# Patient Record
Sex: Female | Born: 1935 | Race: White | Hispanic: No
Health system: Southern US, Community
[De-identification: ages and names within clinical notes are randomized; demographics above are authoritative.]

## PROBLEM LIST (undated history)

## (undated) DIAGNOSIS — Z87820 Personal history of traumatic brain injury: Secondary | ICD-10-CM

## (undated) DIAGNOSIS — G8190 Hemiplegia, unspecified affecting unspecified side: Secondary | ICD-10-CM

## (undated) DIAGNOSIS — F32A Depression, unspecified: Secondary | ICD-10-CM

## (undated) DIAGNOSIS — M6281 Muscle weakness (generalized): Secondary | ICD-10-CM

## (undated) DIAGNOSIS — F419 Anxiety disorder, unspecified: Secondary | ICD-10-CM

## (undated) DIAGNOSIS — S72009D Fracture of unspecified part of neck of unspecified femur, subsequent encounter for closed fracture with routine healing: Secondary | ICD-10-CM

## (undated) DIAGNOSIS — I639 Cerebral infarction, unspecified: Secondary | ICD-10-CM

## (undated) DIAGNOSIS — F0390 Unspecified dementia without behavioral disturbance: Secondary | ICD-10-CM

## (undated) DIAGNOSIS — E785 Hyperlipidemia, unspecified: Secondary | ICD-10-CM

## (undated) DIAGNOSIS — D649 Anemia, unspecified: Secondary | ICD-10-CM

## (undated) DIAGNOSIS — F329 Major depressive disorder, single episode, unspecified: Secondary | ICD-10-CM

## (undated) DIAGNOSIS — R262 Difficulty in walking, not elsewhere classified: Secondary | ICD-10-CM

## (undated) DIAGNOSIS — Z9181 History of falling: Secondary | ICD-10-CM

## (undated) HISTORY — DX: Personal history of traumatic brain injury: Z87.820

## (undated) HISTORY — DX: Muscle weakness (generalized): M62.81

## (undated) HISTORY — DX: Depression, unspecified: F32.A

## (undated) HISTORY — DX: Anxiety disorder, unspecified: F41.9

## (undated) HISTORY — DX: Anemia, unspecified: D64.9

## (undated) HISTORY — DX: Difficulty in walking, not elsewhere classified: R26.2

## (undated) HISTORY — DX: Major depressive disorder, single episode, unspecified: F32.9

## (undated) HISTORY — DX: History of falling: Z91.81

## (undated) HISTORY — DX: Fracture of unspecified part of neck of unspecified femur, subsequent encounter for closed fracture with routine healing: S72.009D

---

## 2008-08-08 ENCOUNTER — Ambulatory Visit: Payer: Self-pay | Admitting: Diagnostic Radiology

## 2008-08-08 ENCOUNTER — Emergency Department (HOSPITAL_BASED_OUTPATIENT_CLINIC_OR_DEPARTMENT_OTHER): Admission: EM | Admit: 2008-08-08 | Discharge: 2008-08-09 | Payer: Self-pay | Admitting: Emergency Medicine

## 2010-04-29 ENCOUNTER — Emergency Department (HOSPITAL_BASED_OUTPATIENT_CLINIC_OR_DEPARTMENT_OTHER)
Admission: EM | Admit: 2010-04-29 | Discharge: 2010-04-29 | Disposition: A | Discharge status: Home / Self Care | Payer: Medicare Other | Attending: Emergency Medicine | Admitting: Emergency Medicine

## 2010-04-29 ENCOUNTER — Emergency Department (HOSPITAL_BASED_OUTPATIENT_CLINIC_OR_DEPARTMENT_OTHER): Payer: Medicare Other

## 2010-04-29 DIAGNOSIS — E785 Hyperlipidemia, unspecified: Secondary | ICD-10-CM | POA: Insufficient documentation

## 2010-04-29 DIAGNOSIS — S0003XA Contusion of scalp, initial encounter: Secondary | ICD-10-CM | POA: Insufficient documentation

## 2010-04-29 DIAGNOSIS — F329 Major depressive disorder, single episode, unspecified: Secondary | ICD-10-CM | POA: Insufficient documentation

## 2010-04-29 DIAGNOSIS — S1093XA Contusion of unspecified part of neck, initial encounter: Secondary | ICD-10-CM | POA: Insufficient documentation

## 2010-04-29 DIAGNOSIS — S0083XA Contusion of other part of head, initial encounter: Secondary | ICD-10-CM

## 2010-04-29 DIAGNOSIS — Z79899 Other long term (current) drug therapy: Secondary | ICD-10-CM | POA: Insufficient documentation

## 2010-04-29 DIAGNOSIS — I10 Essential (primary) hypertension: Secondary | ICD-10-CM | POA: Insufficient documentation

## 2010-04-29 DIAGNOSIS — Y92009 Unspecified place in unspecified non-institutional (private) residence as the place of occurrence of the external cause: Secondary | ICD-10-CM | POA: Insufficient documentation

## 2010-04-29 DIAGNOSIS — R51 Headache: Secondary | ICD-10-CM

## 2010-04-29 DIAGNOSIS — W06XXXA Fall from bed, initial encounter: Secondary | ICD-10-CM

## 2010-04-29 DIAGNOSIS — R220 Localized swelling, mass and lump, head: Secondary | ICD-10-CM | POA: Insufficient documentation

## 2010-04-29 DIAGNOSIS — M542 Cervicalgia: Secondary | ICD-10-CM

## 2010-04-29 DIAGNOSIS — G319 Degenerative disease of nervous system, unspecified: Secondary | ICD-10-CM | POA: Insufficient documentation

## 2010-04-29 DIAGNOSIS — Z8679 Personal history of other diseases of the circulatory system: Secondary | ICD-10-CM | POA: Insufficient documentation

## 2010-04-29 DIAGNOSIS — F3289 Other specified depressive episodes: Secondary | ICD-10-CM | POA: Insufficient documentation

## 2010-09-01 ENCOUNTER — Emergency Department (INDEPENDENT_AMBULATORY_CARE_PROVIDER_SITE_OTHER): Payer: Medicare Other

## 2010-09-01 ENCOUNTER — Emergency Department (HOSPITAL_BASED_OUTPATIENT_CLINIC_OR_DEPARTMENT_OTHER)
Admission: EM | Admit: 2010-09-01 | Discharge: 2010-09-01 | Disposition: A | Discharge status: Home / Self Care | Payer: Medicare Other | Attending: Emergency Medicine | Admitting: Emergency Medicine

## 2010-09-01 DIAGNOSIS — R05 Cough: Secondary | ICD-10-CM

## 2010-09-01 DIAGNOSIS — R0602 Shortness of breath: Secondary | ICD-10-CM

## 2010-09-01 DIAGNOSIS — E785 Hyperlipidemia, unspecified: Secondary | ICD-10-CM | POA: Insufficient documentation

## 2010-09-01 DIAGNOSIS — R0902 Hypoxemia: Secondary | ICD-10-CM | POA: Insufficient documentation

## 2010-09-01 DIAGNOSIS — J449 Chronic obstructive pulmonary disease, unspecified: Secondary | ICD-10-CM | POA: Insufficient documentation

## 2010-09-01 DIAGNOSIS — F0390 Unspecified dementia without behavioral disturbance: Secondary | ICD-10-CM | POA: Insufficient documentation

## 2010-09-01 DIAGNOSIS — M810 Age-related osteoporosis without current pathological fracture: Secondary | ICD-10-CM | POA: Insufficient documentation

## 2010-09-01 DIAGNOSIS — Z79899 Other long term (current) drug therapy: Secondary | ICD-10-CM | POA: Insufficient documentation

## 2010-09-01 DIAGNOSIS — J4489 Other specified chronic obstructive pulmonary disease: Secondary | ICD-10-CM | POA: Insufficient documentation

## 2010-09-01 DIAGNOSIS — I10 Essential (primary) hypertension: Secondary | ICD-10-CM | POA: Insufficient documentation

## 2010-09-01 LAB — DIFFERENTIAL
Basophils Relative: 1 % (ref 0–1)
Lymphocytes Relative: 25 % (ref 12–46)
Lymphs Abs: 1.7 10*3/uL (ref 0.7–4.0)
Monocytes Absolute: 0.7 10*3/uL (ref 0.1–1.0)
Monocytes Relative: 10 % (ref 3–12)
Neutro Abs: 4 10*3/uL (ref 1.7–7.7)
Neutrophils Relative %: 59 % (ref 43–77)

## 2010-09-01 LAB — CBC
HCT: 39.7 % (ref 36.0–46.0)
Hemoglobin: 12.6 g/dL (ref 12.0–15.0)
MCH: 27.9 pg (ref 26.0–34.0)
MCHC: 31.7 g/dL (ref 30.0–36.0)
RBC: 4.51 MIL/uL (ref 3.87–5.11)

## 2010-09-01 LAB — BASIC METABOLIC PANEL
CO2: 26 mEq/L (ref 19–32)
Chloride: 101 mEq/L (ref 96–112)
Creatinine, Ser: 1.1 mg/dL (ref 0.4–1.2)
GFR calc Af Amer: 58 mL/min — ABNORMAL LOW (ref >60)
Glucose, Bld: 98 mg/dL (ref 70–99)
Sodium: 137 mEq/L (ref 135–145)

## 2011-05-24 ENCOUNTER — Emergency Department (INDEPENDENT_AMBULATORY_CARE_PROVIDER_SITE_OTHER): Payer: Medicare Other

## 2011-05-24 ENCOUNTER — Encounter (HOSPITAL_BASED_OUTPATIENT_CLINIC_OR_DEPARTMENT_OTHER): Payer: Self-pay | Admitting: *Deleted

## 2011-05-24 ENCOUNTER — Emergency Department (HOSPITAL_BASED_OUTPATIENT_CLINIC_OR_DEPARTMENT_OTHER)
Admission: EM | Admit: 2011-05-24 | Discharge: 2011-05-24 | Disposition: A | Discharge status: Skilled Nursing Facility | Payer: Medicare Other | Attending: Emergency Medicine | Admitting: Emergency Medicine

## 2011-05-24 DIAGNOSIS — R918 Other nonspecific abnormal finding of lung field: Secondary | ICD-10-CM

## 2011-05-24 DIAGNOSIS — F0390 Unspecified dementia without behavioral disturbance: Secondary | ICD-10-CM | POA: Insufficient documentation

## 2011-05-24 DIAGNOSIS — X58XXXA Exposure to other specified factors, initial encounter: Secondary | ICD-10-CM | POA: Insufficient documentation

## 2011-05-24 DIAGNOSIS — M949 Disorder of cartilage, unspecified: Secondary | ICD-10-CM

## 2011-05-24 DIAGNOSIS — M545 Low back pain, unspecified: Secondary | ICD-10-CM | POA: Insufficient documentation

## 2011-05-24 DIAGNOSIS — M549 Dorsalgia, unspecified: Secondary | ICD-10-CM

## 2011-05-24 DIAGNOSIS — Y9210 Unspecified residential institution as the place of occurrence of the external cause: Secondary | ICD-10-CM | POA: Insufficient documentation

## 2011-05-24 DIAGNOSIS — Z7982 Long term (current) use of aspirin: Secondary | ICD-10-CM | POA: Insufficient documentation

## 2011-05-24 DIAGNOSIS — M8448XA Pathological fracture, other site, initial encounter for fracture: Secondary | ICD-10-CM

## 2011-05-24 DIAGNOSIS — S32009A Unspecified fracture of unspecified lumbar vertebra, initial encounter for closed fracture: Secondary | ICD-10-CM | POA: Insufficient documentation

## 2011-05-24 DIAGNOSIS — E785 Hyperlipidemia, unspecified: Secondary | ICD-10-CM | POA: Insufficient documentation

## 2011-05-24 DIAGNOSIS — Z8673 Personal history of transient ischemic attack (TIA), and cerebral infarction without residual deficits: Secondary | ICD-10-CM | POA: Insufficient documentation

## 2011-05-24 DIAGNOSIS — M479 Spondylosis, unspecified: Secondary | ICD-10-CM

## 2011-05-24 DIAGNOSIS — IMO0002 Reserved for concepts with insufficient information to code with codable children: Secondary | ICD-10-CM

## 2011-05-24 DIAGNOSIS — I719 Aortic aneurysm of unspecified site, without rupture: Secondary | ICD-10-CM | POA: Insufficient documentation

## 2011-05-24 DIAGNOSIS — I714 Abdominal aortic aneurysm, without rupture: Secondary | ICD-10-CM

## 2011-05-24 DIAGNOSIS — R222 Localized swelling, mass and lump, trunk: Secondary | ICD-10-CM | POA: Insufficient documentation

## 2011-05-24 HISTORY — DX: Hyperlipidemia, unspecified: E78.5

## 2011-05-24 HISTORY — DX: Unspecified dementia, unspecified severity, without behavioral disturbance, psychotic disturbance, mood disturbance, and anxiety: F03.90

## 2011-05-24 HISTORY — DX: Cerebral infarction, unspecified: I63.9

## 2011-05-24 HISTORY — DX: Hemiplegia, unspecified affecting unspecified side: G81.90

## 2011-05-24 MED ORDER — HYDROCODONE-ACETAMINOPHEN 5-500 MG PO TABS: 1.0000 | ORAL_TABLET | Freq: Four times a day (QID) | ORAL | Status: AC | PRN

## 2011-05-24 MED ORDER — HYDROCODONE-ACETAMINOPHEN 5-500 MG PO TABS: 1.0000 | ORAL_TABLET | Freq: Four times a day (QID) | ORAL | Status: DC | PRN

## 2011-05-24 NOTE — ED Notes (Signed)
EMS reports patient has had right lower back pain for the last 2 days.  No known injury or fall.

## 2011-05-24 NOTE — ED Provider Notes (Signed)
History     CSN: [id number]  Arrival date & time 05/24/11  1105   First MD Initiated Contact with Patient 05/24/11 1127      Chief Complaint  Patient presents with  . Back Pain    right    (Consider location/radiation/quality/duration/timing/severity/associated sxs/prior treatment) HPI Comments: Patient was sent [facility] with complaints of lower back pain. She does have a history of dementia, but complains of pain to her right lower back area, particularly when she moves her, report. She's had some recent coughing, chest congestion, and was recently treated with antibiotics per report the cough has been improving, but now patient is complaining of some low back pain. There is no known history of any recent falls. She denies any weakness or numbness to extremities. However, her history is limited due to her dementia  The history is provided by the patient.    Past Medical History  Diagnosis Date  . Dementia   . CVA (cerebral infarction)   . Hemiparesis     right  . Dysphagia   . Osteoporosis   . Hyperlipemia     History reviewed. No pertinent past surgical history.  No family history on file.  History  Substance Use Topics  . Smoking status: Never Smoker   . Smokeless tobacco: Not on file  . Alcohol Use: No    OB History    Grav Para Term Preterm Abortions TAB SAB Ect Mult Living                  Review of Systems  Unable to perform ROS: Dementia    Allergies  Penicillins  Home Medications   Current Outpatient Rx  Name Route Sig Dispense Refill  . ACETAMINOPHEN 500 MG PO TABS Oral Take 1,000 mg by mouth every 6 (six) hours as needed.    . ALBUTEROL SULFATE (2.5 MG/3ML) 0.083% IN NEBU Nebulization Take 2.5 mg by nebulization every 6 (six) hours as needed.    . ASPIRIN 81 MG PO TABS Oral Take 81 mg by mouth daily.    Marland Kitchen VITAMIN D 1000 UNITS PO TABS Oral Take 1,000 Units by mouth daily.    Marland Kitchen DEXTROMETHORPHAN HBR 15 MG/5ML PO SYRP Oral Take 10  mLs by mouth 4 (four) times daily as needed.    Marland Kitchen DIVALPROEX SODIUM 125 MG PO CPSP Oral Take 125 mg by mouth 2 (two) times daily.    . DONEPEZIL HCL 5 MG PO TABS Oral Take 5 mg by mouth at bedtime as needed.    Marland Kitchen FAMOTIDINE 20 MG PO TABS Oral Take 20 mg by mouth 2 (two) times daily.    Marland Kitchen LORAZEPAM 0.5 MG PO TABS Oral Take 0.5 mg by mouth every 8 (eight) hours.    Marland Kitchen MINERAL OIL LIGHT OIL Does not apply by Does not apply route.    . SENNA 8.6 MG PO TABS Oral Take 1 tablet by mouth at bedtime as needed.    . SERTRALINE HCL 50 MG PO TABS Oral Take 50 mg by mouth daily.    Marland Kitchen SIMVASTATIN 10 MG PO TABS Oral Take 10 mg by mouth at bedtime.    Marland Kitchen VITAMIN A 40981 UNITS PO CAPS Oral Take 10,000 Units by mouth daily.    Marland Kitchen HYDROCODONE-ACETAMINOPHEN 5-500 MG PO TABS Oral Take 1-2 tablets by mouth every 6 (six) hours as needed for pain. 15 tablet 0    BP 177/75  Pulse 67  Temp(Src) 97.8 F (36.6 C) (  Oral)  Resp 20  Ht 5\' 5"  (1.651 m)  Wt 140 lb (63.504 kg)  BMI 23.30 kg/m2  SpO2 96%  Physical Exam  Constitutional: She is oriented to person, place, and time. She appears well-developed and well-nourished.  HENT:  Head: Normocephalic and atraumatic.  Eyes: Pupils are equal, round, and reactive to light.  Neck: Normal range of motion. Neck supple.  Cardiovascular: Normal rate, regular rhythm and normal heart sounds.   Pulmonary/Chest: Effort normal and breath sounds normal. No respiratory distress. She has no wheezes. She has no rales. She exhibits no tenderness.  Abdominal: Soft. Bowel sounds are normal. There is no tenderness. There is no rebound and no guarding.  Musculoskeletal: Normal range of motion. She exhibits no edema.       Patient has some mild tenderness to palpation of the right lower lumbar region. There is no specific pain on palpation of the spine. Negative straight leg raise bilaterally. She seems to have normal sensation motor function in extremities. Normal pulses and extremities    Lymphadenopathy:    She has no cervical adenopathy.  Neurological: She is alert and oriented to person, place, and time.  Skin: Skin is warm and dry. No rash noted.  Psychiatric: She has a normal mood and affect.    ED Course  Procedures (including critical care time)  Labs Reviewed - No data to display Dg Chest 2 View  05/24/2011  *RADIOLOGY REPORT*  Clinical Data: Right low back pain.  CHEST - 2 VIEW  Comparison: 09/01/2010.  Findings: Trachea is midline.  Heart size stable. Tortuous descending thoracic aorta and/or moderate hiatal hernia, stable. There is soft tissue prominence of the right hilum with added density in the right infrahilar region.  Findings appear new from 09/01/2010.  The lungs are hyperinflated but otherwise clear.  No pleural fluid.  Lower thoracic or upper lumbar vertebroplasty.  Probable upper lumbar compression fracture.  IMPRESSION: Right hilar fullness with right infrahilar soft tissue density, new from the prior study.  Non emergent chest CT with contrast is recommended. These results will be called to the ordering clinician or representative by the Radiologist Assistant, and communication documented in the PACS Dashboard.  Original Report Authenticated By: Reyes Ivan, M.D.   Dg Lumbar Spine Complete  05/24/2011  *RADIOLOGY REPORT*  Clinical Data: Right lower back pain.  LUMBAR SPINE - COMPLETE 4+ VIEW  Comparison: None.  Findings: Osteopenia.  Mild levoconvex curvature of the lumbar spine.  L1 vertebroplasty. Moderate L4 compression fracture. Alignment is otherwise anatomic and vertebral body height maintained.  Multilevel endplate degenerative changes are seen. Findings are worst at L5-S1, where there is loss of disc space height and facet hypertrophy.  There is atherosclerotic calcification of the arterial vasculature. Infrarenal aorta measures up to approximately 4.7 cm.  IMPRESSION:  1.  L4 compression fracture, age determinate. Per CMS PQRS reporting  requirements (PQRS Measure 24): Given the patient's age of greater than 50 and the fracture site (hip, distal radius, or spine), the patient should be tested for osteoporosis using DXA, and the appropriate treatment considered based on the DXA results. 2.  Osteopenia with multilevel spondylosis, worst at L5-S1. 3.  Infrarenal aortic aneurysm.  Original Report Authenticated By: Reyes Ivan, M.D.     1. Back pain   2. Compression fracture   3. Aortic aneurysm   4. Lung mass       MDM  Patient with L4 compression fracture uncertain if this is old or new. She  has previously been treated in the past for compression fracture. This is likely the cause of patient's pain of note, she did have some incidental findings including a right parahilar mass on a chest x-ray and an infrarenal aortic aneurysm measuring 4.7 cm. I did advise the nurse practitioner that manages the patient at [facility] of these findings and they will follow this up as an outpatient. The nurse practitioner's name is  [employer].  She will be seeing the patient tomorrow        Rolan Bucco, MD 05/24/11 1433

## 2011-05-24 NOTE — Discharge Instructions (Signed)
Back Pain, Adult Low back pain is very common. About 1 in 5 people have back pain.The cause of low back pain is rarely dangerous. The pain often gets better over time.About half of people with a sudden onset of back pain feel better in just 2 weeks. About 8 in 10 people feel better by 6 weeks.  CAUSES Some common causes of back pain include:  Strain of the muscles or ligaments supporting the spine.   Wear and tear (degeneration) of the spinal discs.   Arthritis.   Direct injury to the back.  DIAGNOSIS Most of the time, the direct cause of low back pain is not known.However, back pain can be treated effectively even when the exact cause of the pain is unknown.Answering your caregiver's questions about your overall health and symptoms is one of the most accurate ways to make sure the cause of your pain is not dangerous. If your caregiver needs more information, he or she may order lab work or imaging tests (X-rays or MRIs).However, even if imaging tests show changes in your back, this usually does not require surgery. HOME CARE INSTRUCTIONS For many people, back pain returns.Since low back pain is rarely dangerous, it is often a condition that people can learn to manageon their own.   Remain active. It is stressful on the back to sit or stand in one place. Do not sit, drive, or stand in one place for more than 30 minutes at a time. Take short walks on level surfaces as soon as pain allows.Try to increase the length of time you walk each day.   Do not stay in bed.Resting more than 1 or 2 days can delay your recovery.   Do not avoid exercise or work.Your body is made to move.It is not dangerous to be active, even though your back may hurt.Your back will likely heal faster if you return to being active before your pain is gone.   Pay attention to your body when you bend and lift. Many people have less discomfortwhen lifting if they bend their knees, keep the load close to their  bodies,and avoid twisting. Often, the most comfortable positions are those that put less stress on your recovering back.   Find a comfortable position to sleep. Use a firm mattress and lie on your side with your knees slightly bent. If you lie on your back, put a pillow under your knees.   Only take over-the-counter or prescription medicines as directed by your caregiver. Over-the-counter medicines to reduce pain and inflammation are often the most helpful.Your caregiver may prescribe muscle relaxant drugs.These medicines help dull your pain so you can more quickly return to your normal activities and healthy exercise.   Put ice on the injured area.   Put ice in a plastic bag.   Place a towel between your skin and the bag.   Leave the ice on for 15 to 20 minutes, 3 to 4 times a day for the first 2 to 3 days. After that, ice and heat may be alternated to reduce pain and spasms.   Ask your caregiver about trying back exercises and gentle massage. This may be of some benefit.   Avoid feeling anxious or stressed.Stress increases muscle tension and can worsen back pain.It is important to recognize when you are anxious or stressed and learn ways to manage it.Exercise is a great option.  SEEK MEDICAL CARE IF:  You have pain that is not relieved with rest or medicine.   You have   pain that does not improve in 1 week.   You have new symptoms.   You are generally not feeling well.  SEEK IMMEDIATE MEDICAL CARE IF:   You have pain that radiates from your back into your legs.   You develop new bowel or bladder control problems.   You have unusual weakness or numbness in your arms or legs.   You develop nausea or vomiting.   You develop abdominal pain.   You feel faint.  Document Released: 03/13/2005 Document Revised: 11/23/2010 Document Reviewed: 08/01/2010 [employer] Patient Information 2012 [employer]. 

## 2011-08-29 ENCOUNTER — Emergency Department (HOSPITAL_BASED_OUTPATIENT_CLINIC_OR_DEPARTMENT_OTHER)
Admission: EM | Admit: 2011-08-29 | Discharge: 2011-08-29 | Disposition: A | Discharge status: Home / Self Care | Payer: Medicare Other | Attending: Emergency Medicine | Admitting: Emergency Medicine

## 2011-08-29 ENCOUNTER — Encounter (HOSPITAL_BASED_OUTPATIENT_CLINIC_OR_DEPARTMENT_OTHER): Payer: Self-pay | Admitting: *Deleted

## 2011-08-29 ENCOUNTER — Emergency Department (HOSPITAL_BASED_OUTPATIENT_CLINIC_OR_DEPARTMENT_OTHER): Payer: Medicare Other

## 2011-08-29 DIAGNOSIS — M542 Cervicalgia: Secondary | ICD-10-CM | POA: Insufficient documentation

## 2011-08-29 DIAGNOSIS — R51 Headache: Secondary | ICD-10-CM | POA: Insufficient documentation

## 2011-08-29 DIAGNOSIS — W19XXXA Unspecified fall, initial encounter: Secondary | ICD-10-CM

## 2011-08-29 DIAGNOSIS — F0390 Unspecified dementia without behavioral disturbance: Secondary | ICD-10-CM | POA: Insufficient documentation

## 2011-08-29 DIAGNOSIS — E785 Hyperlipidemia, unspecified: Secondary | ICD-10-CM | POA: Insufficient documentation

## 2011-08-29 DIAGNOSIS — T1490XA Injury, unspecified, initial encounter: Secondary | ICD-10-CM | POA: Insufficient documentation

## 2011-08-29 DIAGNOSIS — Z79899 Other long term (current) drug therapy: Secondary | ICD-10-CM | POA: Insufficient documentation

## 2011-08-29 NOTE — ED Provider Notes (Signed)
Medical screening examination/treatment/procedure(s) were performed by non-physician practitioner and as supervising physician I was immediately available for consultation/collaboration.    Nelia Shi, MD 08/29/11 [phone]

## 2011-08-29 NOTE — ED Provider Notes (Signed)
History     CSN: [id number]  Arrival date & time 08/29/11  1600   First MD Initiated Contact with Patient 08/29/11 1606      Chief Complaint  Patient presents with  . Fall  . Head Injury    (Consider location/radiation/quality/duration/timing/severity/associated sxs/prior treatment) HPI Comments: Nursing home reports that pt has just gotten out of the shower and gotten dressed and she went to stand up and fell backwards:staff tried to catch her but she fell and hit her head:no loc:pt is acting at baseline  Patient is a 78 y.o. female presenting with fall and head injury. The history is provided by the nursing home. No language interpreter was used.  Fall  Head Injury     Past Medical History  Diagnosis Date  . Dementia   . CVA (cerebral infarction)   . Hemiparesis     right  . Dysphagia   . Osteoporosis   . Hyperlipemia     History reviewed. No pertinent past surgical history.  History reviewed. No pertinent family history.  History  Substance Use Topics  . Smoking status: Never Smoker   . Smokeless tobacco: Not on file  . Alcohol Use: No    OB History    Grav Para Term Preterm Abortions TAB SAB Ect Mult Living                  Review of Systems  Unable to perform ROS: Dementia  Cardiovascular: Negative.     Allergies  Penicillins  Home Medications   Current Outpatient Rx  Name Route Sig Dispense Refill  . ACETAMINOPHEN 500 MG PO TABS Oral Take 1,000 mg by mouth every 6 (six) hours as needed.    . ALBUTEROL SULFATE (2.5 MG/3ML) 0.083% IN NEBU Nebulization Take 2.5 mg by nebulization every 6 (six) hours as needed.    . ASPIRIN 81 MG PO TABS Oral Take 81 mg by mouth daily.    Marland Kitchen VITAMIN D 1000 UNITS PO TABS Oral Take 1,000 Units by mouth daily.    Marland Kitchen DEXTROMETHORPHAN HBR 15 MG/5ML PO SYRP Oral Take 10 mLs by mouth 4 (four) times daily as needed.    Marland Kitchen DIVALPROEX SODIUM 125 MG PO CPSP Oral Take 125 mg by mouth 2 (two) times daily.    . DONEPEZIL HCL 5  MG PO TABS Oral Take 5 mg by mouth at bedtime as needed.    Marland Kitchen FAMOTIDINE 20 MG PO TABS Oral Take 20 mg by mouth 2 (two) times daily.    Marland Kitchen LORAZEPAM 0.5 MG PO TABS Oral Take 0.5 mg by mouth every 8 (eight) hours.    Marland Kitchen MINERAL OIL LIGHT OIL Does not apply by Does not apply route.    . SENNA 8.6 MG PO TABS Oral Take 1 tablet by mouth at bedtime as needed.    . SERTRALINE HCL 50 MG PO TABS Oral Take 50 mg by mouth daily.    Marland Kitchen SIMVASTATIN 10 MG PO TABS Oral Take 10 mg by mouth at bedtime.    Marland Kitchen VITAMIN A 21308 UNITS PO CAPS Oral Take 10,000 Units by mouth daily.      BP 116/72  Pulse 90  Temp(Src) 97.1 F (36.2 C) (Oral)  Resp 18  SpO2 94%  Physical Exam  Nursing note and vitals reviewed. Constitutional: She appears well-developed and well-nourished.  HENT:  Head: Normocephalic and atraumatic.       No sign of head injury noted  Eyes: Conjunctivae are normal.  Cardiovascular: Normal  rate and regular rhythm.   Pulmonary/Chest: Effort normal and breath sounds normal.  Musculoskeletal: Normal range of motion.  Neurological: She is alert.  Skin: Skin is warm and dry.  Psychiatric: She has a normal mood and affect.    ED Course  Procedures (including critical care time)  Labs Reviewed - No data to display Ct Head Wo Contrast  08/29/2011  *RADIOLOGY REPORT*  Clinical Data:  Larey Seat getting out of the shower.  Head pain.  Neck pain. Knot behind right ear  CT HEAD WITHOUT CONTRAST CT CERVICAL SPINE WITHOUT CONTRAST  Technique:  Multidetector CT imaging of the head and cervical spine was performed following the standard protocol without intravenous contrast.  Multiplanar CT image reconstructions of the cervical spine were also generated.  Comparison:  CT head 04/30/2011  CT HEAD  Findings: There is no evidence for acute infarction, intracranial hemorrhage, mass lesion, hydrocephalus, or extra-axial fluid. Moderate to severe atrophy is present.  Advanced chronic microvascular ischemic change is  present in the periventricular and subcortical white matter.  The calvarium is intact.  No significant scalp hematoma is observed.  There is slight air-fluid level left division sphenoid sinus not felt to be acute.  Mild mucosal thickening left ethmoid air cells.  Moderate vascular calcification.  Negative orbits.  The right mastoid tip is somewhat larger than the left.  It is unclear if this could account for the patient's knot behind her right ear.  Compared with 04/30/2011, the appearance is similar.  IMPRESSION: Baseline atrophy with chronic microvascular ischemic change.  No skull fracture or intracranial hemorrhage.  No significant scalp hematoma.  Small air-fluid level left division sphenoid sinus, but no evidence for basilar skull fracture.  CT CERVICAL SPINE  Findings: The patient had difficulty remaining motionless for the study.  Images are suboptimal.  Small or subtle lesions could be overlooked.  There is no visible cervical spine fracture or traumatic subluxation.  Skeletal osteopenia is suggested with coarsened trabeculae.  No prevertebral soft tissue swelling is observed. There is slight facet mediated slip C2 on C3 of approximately 2 mm. Advanced disc space narrowing is present at C4-5, C5-6, and C6-7. There is no intraspinal hematoma.  The odontoid is intact.  There is moderate carotid calcification bilaterally.  Lung apices appear clear.  Trachea midline. No neck masses.  IMPRESSION: Motion degraded exam.  No visible cervical spine fracture.  Original Report Authenticated By: Elsie Stain, M.D.   Ct Cervical Spine Wo Contrast  08/29/2011  *RADIOLOGY REPORT*  Clinical Data:  Larey Seat getting out of the shower.  Head pain.  Neck pain. Knot behind right ear  CT HEAD WITHOUT CONTRAST CT CERVICAL SPINE WITHOUT CONTRAST  Technique:  Multidetector CT imaging of the head and cervical spine was performed following the standard protocol without intravenous contrast.  Multiplanar CT image reconstructions of  the cervical spine were also generated.  Comparison:  CT head 04/30/2011  CT HEAD  Findings: There is no evidence for acute infarction, intracranial hemorrhage, mass lesion, hydrocephalus, or extra-axial fluid. Moderate to severe atrophy is present.  Advanced chronic microvascular ischemic change is present in the periventricular and subcortical white matter.  The calvarium is intact.  No significant scalp hematoma is observed.  There is slight air-fluid level left division sphenoid sinus not felt to be acute.  Mild mucosal thickening left ethmoid air cells.  Moderate vascular calcification.  Negative orbits.  The right mastoid tip is somewhat larger than the left.  It is unclear if this  could account for the patient's knot behind her right ear.  Compared with 04/30/2011, the appearance is similar.  IMPRESSION: Baseline atrophy with chronic microvascular ischemic change.  No skull fracture or intracranial hemorrhage.  No significant scalp hematoma.  Small air-fluid level left division sphenoid sinus, but no evidence for basilar skull fracture.  CT CERVICAL SPINE  Findings: The patient had difficulty remaining motionless for the study.  Images are suboptimal.  Small or subtle lesions could be overlooked.  There is no visible cervical spine fracture or traumatic subluxation.  Skeletal osteopenia is suggested with coarsened trabeculae.  No prevertebral soft tissue swelling is observed. There is slight facet mediated slip C2 on C3 of approximately 2 mm. Advanced disc space narrowing is present at C4-5, C5-6, and C6-7. There is no intraspinal hematoma.  The odontoid is intact.  There is moderate carotid calcification bilaterally.  Lung apices appear clear.  Trachea midline. No neck masses.  IMPRESSION: Motion degraded exam.  No visible cervical spine fracture.  Original Report Authenticated By: Elsie Stain, M.D.     1. Fall       MDM  No acute abnormality noted on ct:pt is okay to go back to the nursing  home        Teressa Lower, NP 08/29/11 1702  Teressa Lower, NP 08/29/11 1703

## 2011-08-29 NOTE — ED Notes (Signed)
Brought in by ems for fall on tile floor ASSISTED TO FLOOR, pt from [employer], denies complaint

## 2012-07-02 ENCOUNTER — Non-Acute Institutional Stay (SKILLED_NURSING_FACILITY): Payer: Medicare Other | Admitting: Internal Medicine

## 2012-07-02 DIAGNOSIS — S7290XD Unspecified fracture of unspecified femur, subsequent encounter for closed fracture with routine healing: Secondary | ICD-10-CM

## 2012-07-02 DIAGNOSIS — S7291XE Unspecified fracture of right femur, subsequent encounter for open fracture type I or II with routine healing: Secondary | ICD-10-CM

## 2012-07-02 DIAGNOSIS — F0390 Unspecified dementia without behavioral disturbance: Secondary | ICD-10-CM

## 2012-07-02 DIAGNOSIS — R0902 Hypoxemia: Secondary | ICD-10-CM

## 2012-07-02 DIAGNOSIS — D62 Acute posthemorrhagic anemia: Secondary | ICD-10-CM

## 2012-07-03 ENCOUNTER — Non-Acute Institutional Stay (SKILLED_NURSING_FACILITY): Payer: Medicare Other | Admitting: Adult Health

## 2012-07-03 ENCOUNTER — Encounter: Payer: Self-pay | Admitting: Adult Health

## 2012-07-03 ENCOUNTER — Other Ambulatory Visit: Payer: Self-pay | Admitting: *Deleted

## 2012-07-03 DIAGNOSIS — F0391 Unspecified dementia with behavioral disturbance: Secondary | ICD-10-CM | POA: Insufficient documentation

## 2012-07-03 DIAGNOSIS — I10 Essential (primary) hypertension: Secondary | ICD-10-CM

## 2012-07-03 DIAGNOSIS — E559 Vitamin D deficiency, unspecified: Secondary | ICD-10-CM

## 2012-07-03 DIAGNOSIS — S72009D Fracture of unspecified part of neck of unspecified femur, subsequent encounter for closed fracture with routine healing: Secondary | ICD-10-CM

## 2012-07-03 DIAGNOSIS — F411 Generalized anxiety disorder: Secondary | ICD-10-CM

## 2012-07-03 DIAGNOSIS — K5900 Constipation, unspecified: Secondary | ICD-10-CM | POA: Insufficient documentation

## 2012-07-03 DIAGNOSIS — E785 Hyperlipidemia, unspecified: Secondary | ICD-10-CM

## 2012-07-03 DIAGNOSIS — S72001D Fracture of unspecified part of neck of right femur, subsequent encounter for closed fracture with routine healing: Secondary | ICD-10-CM

## 2012-07-03 DIAGNOSIS — Z8673 Personal history of transient ischemic attack (TIA), and cerebral infarction without residual deficits: Secondary | ICD-10-CM | POA: Insufficient documentation

## 2012-07-03 DIAGNOSIS — S72001A Fracture of unspecified part of neck of right femur, initial encounter for closed fracture: Secondary | ICD-10-CM | POA: Insufficient documentation

## 2012-07-03 DIAGNOSIS — F329 Major depressive disorder, single episode, unspecified: Secondary | ICD-10-CM

## 2012-07-03 DIAGNOSIS — Z7901 Long term (current) use of anticoagulants: Secondary | ICD-10-CM | POA: Insufficient documentation

## 2012-07-03 MED ORDER — LORAZEPAM 0.5 MG PO TABS: ORAL_TABLET | ORAL | Status: DC

## 2012-07-03 NOTE — Progress Notes (Signed)
  Subjective:    Patient ID: Mallory Herring, female    DOB: 02/01/1931, 79 y.o.   MRN: [id number]  HPI This is an 79 year old female who has been admitted to [facility] [place] on 07/01/12 from [facility]. She fell and sustained a right hip fracture S/P hemiarthroplasty. She has been admitted for rehabilitation and long-term care. She is confused and had an episode of pulling the dressing on her right hip while yelling.   Review of Systems  Constitutional: Negative.   HENT: Negative.   Eyes: Negative.   Respiratory: Negative for chest tightness and shortness of breath.   Cardiovascular: Negative for leg swelling.  Gastrointestinal: Negative for abdominal pain, constipation and abdominal distention.  Endocrine: Negative.   Genitourinary: Negative.   Neurological: Negative.   Hematological: Negative for adenopathy. Does not bruise/bleed easily.  Psychiatric/Behavioral: Negative.        Objective:   Physical Exam  Constitutional: She appears well-developed and well-nourished.  HENT:  Head: Normocephalic.  Right Ear: External ear normal.  Left Ear: External ear normal.  Eyes: Conjunctivae are normal. Pupils are equal, round, and reactive to light.  Neck: Normal range of motion. Neck supple. No thyromegaly present.  Cardiovascular: Normal rate, regular rhythm and normal heart sounds.   Pulmonary/Chest: Effort normal and breath sounds normal. No respiratory distress.  Abdominal: Soft. Bowel sounds are normal. She exhibits no distension. There is no tenderness.  Musculoskeletal: She exhibits no edema.  Neurological: She is alert.  Skin: Skin is warm.  Psychiatric:  Alert to self but confused to time and place   LABS: 07/03/12  Wbc 6.4  hgb 9.4  hct 29.3  Bmp nl except calcium 7.9 CXR shows minimal bronchitis at the right perihilar region       Assessment & Plan:  Patient Active Problem List  Long term (current) use of anticoagulants -INR 2.5 - therapeutic;  continue Coumadin 5 mg PO Q  D  Generalized anxiety disorder - will continue Ativan 0.5 mg PO BID  Other and unspecified hyperlipidemia - stable   Dementia with behavioral disturbance - stable; obtain urinalysis w/ culture  History of CVA (cerebrovascular accident) - stable  Unspecified constipation - stable  Depression - stable  Unspecified vitamin D deficiency - continue supplementation  Hip fracture, right S/P hemiarthroplasty - for PT and OT

## 2012-07-06 ENCOUNTER — Non-Acute Institutional Stay (SKILLED_NURSING_FACILITY): Payer: Medicare Other | Admitting: Adult Health

## 2012-07-06 DIAGNOSIS — S72001D Fracture of unspecified part of neck of right femur, subsequent encounter for closed fracture with routine healing: Secondary | ICD-10-CM

## 2012-07-06 DIAGNOSIS — Z7901 Long term (current) use of anticoagulants: Secondary | ICD-10-CM

## 2012-07-06 DIAGNOSIS — S72009D Fracture of unspecified part of neck of unspecified femur, subsequent encounter for closed fracture with routine healing: Secondary | ICD-10-CM

## 2012-07-09 ENCOUNTER — Non-Acute Institutional Stay (SKILLED_NURSING_FACILITY): Payer: Medicare Other | Admitting: Adult Health

## 2012-07-09 DIAGNOSIS — Z7901 Long term (current) use of anticoagulants: Secondary | ICD-10-CM

## 2012-07-09 DIAGNOSIS — S72001D Fracture of unspecified part of neck of right femur, subsequent encounter for closed fracture with routine healing: Secondary | ICD-10-CM

## 2012-07-09 DIAGNOSIS — S72009D Fracture of unspecified part of neck of unspecified femur, subsequent encounter for closed fracture with routine healing: Secondary | ICD-10-CM

## 2012-07-12 ENCOUNTER — Non-Acute Institutional Stay (SKILLED_NURSING_FACILITY): Payer: Medicare Other | Admitting: Adult Health

## 2012-07-12 DIAGNOSIS — S72001D Fracture of unspecified part of neck of right femur, subsequent encounter for closed fracture with routine healing: Secondary | ICD-10-CM

## 2012-07-12 DIAGNOSIS — S72009D Fracture of unspecified part of neck of unspecified femur, subsequent encounter for closed fracture with routine healing: Secondary | ICD-10-CM

## 2012-07-12 DIAGNOSIS — Z7901 Long term (current) use of anticoagulants: Secondary | ICD-10-CM

## 2012-07-16 ENCOUNTER — Non-Acute Institutional Stay (SKILLED_NURSING_FACILITY): Payer: Medicare Other | Admitting: Adult Health

## 2012-07-16 DIAGNOSIS — Z7901 Long term (current) use of anticoagulants: Secondary | ICD-10-CM

## 2012-07-16 DIAGNOSIS — S72001D Fracture of unspecified part of neck of right femur, subsequent encounter for closed fracture with routine healing: Secondary | ICD-10-CM

## 2012-07-16 DIAGNOSIS — S72009D Fracture of unspecified part of neck of unspecified femur, subsequent encounter for closed fracture with routine healing: Secondary | ICD-10-CM

## 2012-07-17 NOTE — Progress Notes (Signed)
Patient ID: Mallory Herring, female   DOB: March 28, 1930, 79 y.o.   MRN: [id number]        HISTORY & PHYSICAL  DATE:  07/02/2012  FACILITY: [facility]   LEVEL OF CARE: SNF   ALLERGIES:  Allergies  Allergen Reactions  . Penicillins     CHIEF COMPLAINT:  Manage right hip intertrochanteric fracture, acute blood loss anemia and dementia.    HISTORY OF PRESENT ILLNESS:  79 year-old, Caucasian female.    HIP FRACTURE: The patient had a mechanical fall and sustained a femur fracture.  Patient subsequently underwent right hip hemiarthroplasty and tolerated the procedure well. Patient is admitted to this facility for short-term rehabilitation. Patient denies hip pain currently. No complications reported from the pain medications currently being used.   ANEMIA: The patient suffered acute blood loss anemia postsurgically and received 1 U of packed red blood cells.    The anemia has been stable. The patient denies fatigue, melena or hematochezia. No complications from the medications currently being used.   DEMENTIA: The dementia remains stable and continues to function adequately in the current living environment with supervision.  The patient has had little changes in behavior. No complications noted from the medications presently being used.  The patient is able to respond to commands.   PAST MEDICAL HISTORY :  Past Medical History  Diagnosis Date  . Dementia   . CVA (cerebral infarction)   . Hemiparesis     right  . Dysphagia   . Osteoporosis   . Hyperlipemia    Depression.    Unsteady gait.    History of traumatic brain injury.  Left hip fracture.   PAST SURGICAL HISTORY:  Hysterectomy.    Tonsillectomy.    Left hand surgery.    Left leg surgery.  SOCIAL HISTORY:  reports that she has never smoked. She does not have any smokeless tobacco history on file. She reports that she does not drink alcohol or use illicit drugs. HOUSING:  The patient was in an assisted living  facility.   TOBACCO USE:  She has a history of smoking, but quit 40 years ago and she had smoked for 30 years.     ALCOHOL:  No history of alcohol use.  ILLICIT DRUGS:  No history of illicit drug use.    FAMILY HISTORY: Patient cannot recall.  CURRENT MEDICATIONS: Reviewed per [facility]  REVIEW OF SYSTEMS:  See HPI otherwise 14 point ROS is negative.  PHYSICAL EXAMINATION  VS:  T  97.5      P 90       RR 20       BP 120/80       POX%        WT (Lb)  GENERAL: no acute distress, normal body habitus EYES: conjunctivae normal, sclerae normal, normal eye lids MOUTH/THROAT: lips without lesions,no lesions in the mouth,tongue is without lesions,uvula elevates in midline NECK: supple, trachea midline, no neck masses, no thyroid tenderness, no thyromegaly LYMPHATICS: no LAN in the neck, no supraclavicular LAN RESPIRATORY: breathing is even & unlabored, BS CTAB CARDIAC: RRR, no murmur,no extra heart sounds, no edema GI:  ABDOMEN: abdomen soft, normal BS, no masses, no tenderness  LIVER/SPLEEN: no hepatomegaly, no splenomegaly MUSCULOSKELETAL: HEAD: normal to inspection & palpation BACK: no kyphosis, scoliosis or spinal processes tenderness EXTREMITIES: LEFT UPPER EXTREMITY: full range of motion, normal strength & tone RIGHT UPPER EXTREMITY:  full range of motion, normal strength & tone LEFT LOWER EXTREMITY: in a brace RIGHT  LOWER EXTREMITY:  full range of motion, normal strength & tone PSYCHIATRIC: the patient is alert & oriented to person, affect & behavior appropriate  LABS/RADIOLOGY: X-ray of the hip showed right femoral intertrochanteric fracture with varus angulation.    CT of the head normal.   Chest x-ray did not show acute findings.    CMP normal.    Depakote level 19.3.    CBC normal.   EKG showed normal sinus rhythm with poor R-wave progression, but not acute ST-T changes.    ASSESSMENT/PLAN:  Right hip intertrochanteric fracture.  Status post right hip hemiarthroplasty.   Continue rehabilitation.   Acute blood loss anemia.  Status post transfusion.  Continue iron.  Reassess hemoglobin.   Dementia.  Stable.   Hypoxemia.  New problem, staff report acutely.  Patient's pulse ox is now 79% on room air.  Placed on 2 L of oxygen to keep pulse ox greater than 90%.  Obtain chest x-ray.    Constipation.  Continue current laxatives.   Hyperlipidemia.  Continue atorvastatin.   Check CBC and BMP.    I have reviewed patient's medical records received at admission/from hospitalization.  CPT CODE: 16109

## 2012-07-23 ENCOUNTER — Non-Acute Institutional Stay (SKILLED_NURSING_FACILITY): Payer: Medicare Other | Admitting: Adult Health

## 2012-07-23 DIAGNOSIS — Z7901 Long term (current) use of anticoagulants: Secondary | ICD-10-CM

## 2012-07-23 DIAGNOSIS — S72009D Fracture of unspecified part of neck of unspecified femur, subsequent encounter for closed fracture with routine healing: Secondary | ICD-10-CM

## 2012-07-23 DIAGNOSIS — S72001D Fracture of unspecified part of neck of right femur, subsequent encounter for closed fracture with routine healing: Secondary | ICD-10-CM

## 2012-07-30 ENCOUNTER — Non-Acute Institutional Stay (SKILLED_NURSING_FACILITY): Payer: Medicare Other | Admitting: Adult Health

## 2012-07-30 DIAGNOSIS — Z7901 Long term (current) use of anticoagulants: Secondary | ICD-10-CM

## 2012-07-30 DIAGNOSIS — S72001D Fracture of unspecified part of neck of right femur, subsequent encounter for closed fracture with routine healing: Secondary | ICD-10-CM

## 2012-07-30 DIAGNOSIS — S72009D Fracture of unspecified part of neck of unspecified femur, subsequent encounter for closed fracture with routine healing: Secondary | ICD-10-CM

## 2012-08-06 ENCOUNTER — Non-Acute Institutional Stay (SKILLED_NURSING_FACILITY): Payer: Medicare Other | Admitting: Adult Health

## 2012-08-06 DIAGNOSIS — S72001D Fracture of unspecified part of neck of right femur, subsequent encounter for closed fracture with routine healing: Secondary | ICD-10-CM

## 2012-08-06 DIAGNOSIS — S72009D Fracture of unspecified part of neck of unspecified femur, subsequent encounter for closed fracture with routine healing: Secondary | ICD-10-CM

## 2012-08-06 DIAGNOSIS — Z7901 Long term (current) use of anticoagulants: Secondary | ICD-10-CM

## 2012-08-27 ENCOUNTER — Encounter (HOSPITAL_BASED_OUTPATIENT_CLINIC_OR_DEPARTMENT_OTHER): Payer: Self-pay | Admitting: *Deleted

## 2012-08-28 ENCOUNTER — Encounter: Payer: Self-pay | Admitting: Adult Health

## 2012-08-28 NOTE — Progress Notes (Signed)
Patient ID: Mallory Herring, female   DOB: 04/18/1930, 79 y.o.   MRN: [id number]  Subjective:     Indication: DVT prophylaxis Bleeding signs/symptoms: None Thromboembolic signs/symptoms: None Missed Coumadin doses: None Medication changes: no Dietary changes: no Bacterial/viral infection: no Other concerns: no  The following portions of the patient's history were reviewed and updated as appropriate: allergies, current medications, past family history, past medical history, past social history, past surgical history and problem list.  Review of Systems A comprehensive review of systems was negative.   Objective:    INR Today: 5.4 Current dose:   Coumadin 4.5 mg PO Q D  Assessment:    therapeutic INR for goal of 2-3   Plan:    1. New dose:Hold Coumadin x 2 days 2. Next INR:   08/08/12

## 2012-08-28 NOTE — Progress Notes (Signed)
Patient ID: Mallory Herring, female   DOB: 1930/04/19, 79 y.o.   MRN: [id number]  Subjective:     Indication: DVT prophylaxis Bleeding signs/symptoms: None Thromboembolic signs/symptoms: None Missed Coumadin doses: None Medication changes: no Dietary changes: no Bacterial/viral infection: no Other concerns: no  The following portions of the patient's history were reviewed and updated as appropriate: allergies, current medications, past family history, past medical history, past social history, past surgical history and problem list.  Review of Systems A comprehensive review of systems was negative.   Objective:    INR Today: 1.3 Current dose:   Coumadin 4 mg PO Q D  Assessment:    Subtherapeutic INR for goal of 2-3   Plan:    1. New dose:Coumadin 5 mg PO x 1 then Coumadin 4.5 mg PO Q D  2. Next INR:  07/12/12

## 2012-08-28 NOTE — Progress Notes (Signed)
Patient ID: Mallory Herring, female   DOB: [birth year], 79 y.o.   MRN: [id number]  Subjective:     Indication: DVT prophylaxis Bleeding signs/symptoms: None Thromboembolic signs/symptoms: None Missed Coumadin doses: None Medication changes: no Dietary changes: no Bacterial/viral infection: no Other concerns: no  The following portions of the patient's history were reviewed and updated as appropriate: allergies, current medications, past family history, past medical history, past social history, past surgical history and problem list.  Review of Systems A comprehensive review of systems was negative.   Objective:    INR Today: 1.6 Current dose:   Coumadin 4.5 mg PO Q D  Assessment:    Subtherapeutic INR for goal of 2-3   Plan:    1. New dose:continue Coumadin 4.5 mg PO Q D  2. Next INR:  07/16/12

## 2012-08-28 NOTE — Progress Notes (Signed)
Patient ID: Mallory Herring, female   DOB: [birth year], 79 y.o.   MRN: [id number]  Subjective:     Indication: DVT prophylaxis Bleeding signs/symptoms: None Thromboembolic signs/symptoms: None Missed Coumadin doses: None Medication changes: no Dietary changes: no Bacterial/viral infection: no Other concerns: no  The following portions of the patient's history were reviewed and updated as appropriate: allergies, current medications, past family history, past medical history, past social history, past surgical history and problem list.  Review of Systems A comprehensive review of systems was negative.   Objective:    INR Today: 2.6 Current dose:   Coumadin 4.5 mg PO Q D  Assessment:    therapeutic INR for goal of 2-3   Plan:    1. New dose:continue Coumadin 4.5 mg PO Q D  2. Next INR:   08/06/12

## 2012-08-28 NOTE — Progress Notes (Signed)
Patient ID: Mallory Herring, female   DOB: [birth year], 79 y.o.   MRN: [id number]  Subjective:     Indication: DVT prophylaxis Bleeding signs/symptoms: None Thromboembolic signs/symptoms: None Missed Coumadin doses: None Medication changes: no Dietary changes: no Bacterial/viral infection: no Other concerns: no  The following portions of the patient's history were reviewed and updated as appropriate: allergies, current medications, past family history, past medical history, past social history, past surgical history and problem list.  Review of Systems A comprehensive review of systems was negative.   Objective:    INR Today: 2.2 Current dose:   Coumadin 4.5 mg PO Q D  Assessment:    therapeutic INR for goal of 2-3   Plan:    1. New dose:continue Coumadin 4.5 mg PO Q D  2. Next INR:   07/30/12

## 2012-08-28 NOTE — Progress Notes (Signed)
Patient ID: Mallory Herring, female   DOB: 1930/09/18, 79 y.o.   MRN: [id number] Subjective:     Indication: DVT prophylaxis Bleeding signs/symptoms: None Thromboembolic signs/symptoms: None  Missed Coumadin doses: This week - 2 days due to supratherapeutic INR Medication changes: no Dietary changes: no Bacterial/viral infection: no Other concerns: no  The following portions of the patient's history were reviewed and updated as appropriate: allergies, current medications, past family history, past medical history, past social history, past surgical history and problem list.  Review of Systems A comprehensive review of systems was negative.   Objective:    INR Today: 1.8 Current dose:   held x 2 days (Coumadin 5 mg )  Assessment:    Subtherapeutic INR for goal of 2-3   Plan:    1. New dose: decrease Coumadin to 4 mg PO Q D   2. Next INR:  07/09/12

## 2012-08-28 NOTE — Progress Notes (Signed)
Patient ID: Mallory Herring, female   DOB: 12/06/1930, 79 y.o.   MRN: [id number]   Subjective:     Indication: DVT prophylaxis Bleeding signs/symptoms: None Thromboembolic signs/symptoms: None Missed Coumadin doses: None Medication changes: no Dietary changes: no Bacterial/viral infection: no Other concerns: no  The following portions of the patient's history were reviewed and updated as appropriate: allergies, current medications, past family history, past medical history, past social history, past surgical history and problem list.  Review of Systems A comprehensive review of systems was negative.   Objective:    INR Today: 2.7 Current dose:   Coumadin 4.5 mg PO Q D  Assessment:    therapeutic INR for goal of 2-3   Plan:    1. New dose:continue Coumadin 4.5 mg PO Q D  2. Next INR:  07/19/12

## 2012-10-11 ENCOUNTER — Non-Acute Institutional Stay (SKILLED_NURSING_FACILITY): Payer: Medicare Other | Admitting: Adult Health

## 2012-10-11 DIAGNOSIS — E785 Hyperlipidemia, unspecified: Secondary | ICD-10-CM

## 2012-10-11 DIAGNOSIS — F0391 Unspecified dementia with behavioral disturbance: Secondary | ICD-10-CM

## 2012-10-11 DIAGNOSIS — F329 Major depressive disorder, single episode, unspecified: Secondary | ICD-10-CM

## 2012-10-11 DIAGNOSIS — F411 Generalized anxiety disorder: Secondary | ICD-10-CM

## 2012-10-11 DIAGNOSIS — K5900 Constipation, unspecified: Secondary | ICD-10-CM

## 2012-11-14 ENCOUNTER — Non-Acute Institutional Stay (SKILLED_NURSING_FACILITY): Payer: Medicare Other | Admitting: Internal Medicine

## 2012-11-14 DIAGNOSIS — K5900 Constipation, unspecified: Secondary | ICD-10-CM

## 2012-11-14 DIAGNOSIS — E785 Hyperlipidemia, unspecified: Secondary | ICD-10-CM

## 2012-11-14 DIAGNOSIS — D638 Anemia in other chronic diseases classified elsewhere: Secondary | ICD-10-CM

## 2012-11-14 DIAGNOSIS — F0391 Unspecified dementia with behavioral disturbance: Secondary | ICD-10-CM

## 2012-11-15 DIAGNOSIS — D638 Anemia in other chronic diseases classified elsewhere: Secondary | ICD-10-CM | POA: Insufficient documentation

## 2012-11-15 NOTE — Progress Notes (Signed)
PROGRESS NOTE  DATE: 11/14/2012  FACILITY: Nursing Home Location: [facility]  LEVEL OF CARE: SNF (31)  Routine Visit  CHIEF COMPLAINT:  Manage anemia of chronic disease, dementia and hyperlipidemia  HISTORY OF PRESENT ILLNESS:  REASSESSMENT OF ONGOING PROBLEM(S):  ANEMIA: The anemia has been stable. The patient denies fatigue, melena or hematochezia. No complications from the medications currently being used. In 6/14 hemoglobin 12.4  DEMENTIA: The dementia remaines stable and continues to function adequately in the current living environment with supervision.  The patient has had little changes in behavior. No complications noted from the medications presently being used.  HYPERLIPIDEMIA: No complications from the medications presently being used. Last fasting lipid panel showed : In 5/14 HDL 36 otherwise FastIn lipid panel normal  PAST MEDICAL HISTORY : Reviewed.  No changes.  CURRENT MEDICATIONS: Reviewed per [facility]  REVIEW OF SYSTEMS:  GENERAL: no change in appetite, no fatigue, no weight changes, no fever, chills or weakness RESPIRATORY: no cough, SOB, DOE, wheezing, hemoptysis CARDIAC: no chest pain, edema or palpitations GI: no abdominal pain, diarrhea, constipation, heart burn, nausea or vomiting  PHYSICAL EXAMINATION  VS:  T 97       P 76     RR 20      BP 105/73     POX % 95     WT (Lb)  GENERAL: no acute distress, normal body habitus EYES: conjunctivae normal, sclerae normal, normal eye lids NECK: supple, trachea midline, no neck masses, no thyroid tenderness, no thyromegaly LYMPHATICS: no LAN in the neck, no supraclavicular LAN RESPIRATORY: breathing is even & unlabored, BS CTAB CARDIAC: RRR, no murmur,no extra heart sounds, no edema GI: abdomen soft, normal BS, no masses, no tenderness, no hepatomegaly, no splenomegaly PSYCHIATRIC: the patient is alert & oriented to person, affect & behavior appropriate  LABS/RADIOLOGY:  6/14 CBC  normal 4/14 BNP normal  ASSESSMENT/PLAN:  Anemia of chronic disease-hemoglobin normalized. Discontinue iron. Reassess in one month. Hyperlipidemia-well controlled Dementia-stable Constipation-well-controlled GERD-stable Anxiety-stable Depression-stable Check Depakote level and liver profile  CPT CODE: 16109

## 2012-11-24 ENCOUNTER — Encounter: Payer: Self-pay | Admitting: Adult Health

## 2012-11-24 NOTE — Progress Notes (Signed)
Patient ID: Mallory Herring, female   DOB: October 25, 1930, 79 y.o.   MRN: [id number]       PROGRESS NOTE  DATE: 10/11/2012  FACILITY: Nursing Home Location: [facility]  LEVEL OF CARE: SNF (31)  Routine Visit  CHIEF COMPLAINT:  Manage , depression, hyperlipidemia, and dementia  HISTORY OF PRESENT ILLNESS:  REASSESSMENT OF ONGOING PROBLEM(S):  DEPRESSION: The depression remains stable. Patient denies ongoing feelings of sadness, insomnia, anedhonia or lack of appetite. No complications reported from the medications currently being used. Staff do not report behavioral problems.  HYPERLIPIDEMIA: No complications from the medications presently being used.   ANXIETY: The anxiety remains stable. Patient denies ongoing anxiety or irritability. No complications reported from the medications currently being used.  PAST MEDICAL HISTORY : Reviewed.  No changes.  CURRENT MEDICATIONS: Reviewed per [facility]  REVIEW OF SYSTEMS:  GENERAL: no change in appetite, no fatigue, no weight changes, no fever, chills or weakness RESPIRATORY: no cough, SOB, DOE, wheezing, hemoptysis CARDIAC: no chest pain, edema or palpitations GI: no abdominal pain, diarrhea, constipation, heart burn, nausea or vomiting  PHYSICAL EXAMINATION  VS:  T 97.4       P 75      RR 18      BP 122/78     POX 98 %     WT 134.8 (Lb)  GENERAL: no acute distress, normal body habitus EYES: conjunctivae normal, sclerae normal, normal eye lids NECK: supple, trachea midline, no neck masses, no thyroid tenderness, no thyromegaly LYMPHATICS: no LAN in the neck, no supraclavicular LAN RESPIRATORY: breathing is even & unlabored, BS CTAB CARDIAC: RRR, no murmur,no extra heart sounds, no edema GI: abdomen soft, normal BS, no masses, no tenderness, no hepatomegaly, no splenomegaly PSYCHIATRIC: the patient is alert & oriented to person, affect & behavior appropriate  LABS/RADIOLOGY: 09/23/12 WBC 6 point hemoglobin 12.4 hematocrit  39 08/22/12 lipid normal 07/03/12  Wbc 6.4  hgb 9.4  hct 29.3  Bmp nl except calcium 7.9 CXR shows minimal bronchitis at the right perihilar region   ASSESSMENT/PLAN:  Depression - stable  Anxiety - stable  Hyperlipidemia - stable  Dementia - stable  CPT CODE: 40981

## 2012-12-09 ENCOUNTER — Other Ambulatory Visit: Payer: Self-pay | Admitting: *Deleted

## 2012-12-09 MED ORDER — LORAZEPAM 0.5 MG PO TABS: ORAL_TABLET | ORAL | Status: DC

## 2012-12-20 ENCOUNTER — Non-Acute Institutional Stay (SKILLED_NURSING_FACILITY): Payer: Medicare Other | Admitting: Internal Medicine

## 2012-12-20 DIAGNOSIS — K5900 Constipation, unspecified: Secondary | ICD-10-CM

## 2012-12-20 DIAGNOSIS — E785 Hyperlipidemia, unspecified: Secondary | ICD-10-CM

## 2012-12-20 DIAGNOSIS — D638 Anemia in other chronic diseases classified elsewhere: Secondary | ICD-10-CM

## 2012-12-20 DIAGNOSIS — F0391 Unspecified dementia with behavioral disturbance: Secondary | ICD-10-CM

## 2012-12-20 NOTE — Progress Notes (Signed)
PROGRESS NOTE  DATE: 12-20-12  FACILITY: Nursing Home Location: [facility]  LEVEL OF CARE: SNF (31)  Routine Visit  CHIEF COMPLAINT:  Manage anemia of chronic disease, dementia and hyperlipidemia  HISTORY OF PRESENT ILLNESS:  REASSESSMENT OF ONGOING PROBLEM(S):  ANEMIA: The anemia has been stable. The patient denies fatigue, melena or hematochezia. No complications from the medications currently being used. In 6/14 hemoglobin 12.4  DEMENTIA: The dementia remaines stable and continues to function adequately in the current living environment with supervision.  The patient has had little changes in behavior. No complications noted from the medications presently being used.  HYPERLIPIDEMIA: No complications from the medications presently being used. Last fasting lipid panel showed : In 5/14 HDL 36 otherwise FastIn lipid panel normal  PAST MEDICAL HISTORY : Reviewed.  No changes.  CURRENT MEDICATIONS: Reviewed per [facility]  REVIEW OF SYSTEMS:  GENERAL: no change in appetite, no fatigue, no weight changes, no fever, chills or weakness RESPIRATORY: no cough, SOB, DOE, wheezing, hemoptysis CARDIAC: no chest pain, edema or palpitations GI: no abdominal pain, diarrhea, constipation, heart burn, nausea or vomiting  PHYSICAL EXAMINATION  VS:  T 97.2       P 76     RR 20      BP 120/76     POX % 95     WT (Lb)  GENERAL: no acute distress, moderately obese body habitus NECK: supple, trachea midline, no neck masses, no thyroid tenderness, no thyromegaly RESPIRATORY: breathing is even & unlabored, BS CTAB CARDIAC: RRR, no murmur,no extra heart sounds, no edema GI: abdomen soft, normal BS, no masses, no tenderness, no hepatomegaly, no splenomegaly PSYCHIATRIC: the patient is alert & oriented to person, affect & behavior appropriate  LABS/RADIOLOGY:  8-14 albumin 3.2 otherwise he will profile normal, Depakote level 13.8  6/14 CBC normal 4/14 BNP  normal  ASSESSMENT/PLAN:  Anemia of chronic disease-hemoglobin normalized. iron discontinued. Re- check pending Hyperlipidemia-well controlled Dementia-stable Constipation-well-controlled GERD-stable Anxiety-stable Depression-stable  CPT CODE: 82956

## 2013-01-17 ENCOUNTER — Non-Acute Institutional Stay (SKILLED_NURSING_FACILITY): Payer: Medicare Other | Admitting: Adult Health

## 2013-01-17 DIAGNOSIS — F32A Depression, unspecified: Secondary | ICD-10-CM

## 2013-01-17 DIAGNOSIS — E785 Hyperlipidemia, unspecified: Secondary | ICD-10-CM | POA: Diagnosis not present

## 2013-01-17 DIAGNOSIS — F411 Generalized anxiety disorder: Secondary | ICD-10-CM

## 2013-01-17 DIAGNOSIS — F3289 Other specified depressive episodes: Secondary | ICD-10-CM | POA: Diagnosis not present

## 2013-01-17 DIAGNOSIS — Z8673 Personal history of transient ischemic attack (TIA), and cerebral infarction without residual deficits: Secondary | ICD-10-CM

## 2013-01-17 DIAGNOSIS — F03918 Unspecified dementia, unspecified severity, with other behavioral disturbance: Secondary | ICD-10-CM | POA: Diagnosis not present

## 2013-01-17 DIAGNOSIS — G8190 Hemiplegia, unspecified affecting unspecified side: Secondary | ICD-10-CM

## 2013-01-17 DIAGNOSIS — E559 Vitamin D deficiency, unspecified: Secondary | ICD-10-CM

## 2013-01-17 DIAGNOSIS — F419 Anxiety disorder, unspecified: Secondary | ICD-10-CM

## 2013-01-17 DIAGNOSIS — K5900 Constipation, unspecified: Secondary | ICD-10-CM

## 2013-01-17 DIAGNOSIS — F329 Major depressive disorder, single episode, unspecified: Secondary | ICD-10-CM

## 2013-01-17 DIAGNOSIS — F0391 Unspecified dementia with behavioral disturbance: Secondary | ICD-10-CM

## 2013-01-17 DIAGNOSIS — G8191 Hemiplegia, unspecified affecting right dominant side: Secondary | ICD-10-CM | POA: Insufficient documentation

## 2013-01-17 NOTE — Progress Notes (Addendum)
Patient ID: Mallory Herring, female   DOB: [birth year], 79 y.o.   MRN: [id number]       PROGRESS NOTE  DATE: 01/17/13  FACILITY: Nursing Home Location: [facility]  LEVEL OF CARE: SNF (31)  Routine Visit  CHIEF COMPLAINT:  Manage Depression, Constipation, dementia and hyperlipidemia  HISTORY OF PRESENT ILLNESS:  REASSESSMENT OF ONGOING PROBLEM(S):  ANXIETY: The anxiety remains stable. Patient denies ongoing anxiety or irritability. No complications reported from the medications currently being used.  CONSTIPATION: The constipation remains stable. No complications from the medications presently being used. Patient denies ongoing constipation, abdominal pain, nausea or vomiting.  DEMENTIA: The dementia remaines stable and continues to function adequately in the current living environment with supervision.  The patient has had little changes in behavior. No complications noted from the medications presently being used.   PAST MEDICAL HISTORY : Reviewed.  No changes.  CURRENT MEDICATIONS: Reviewed per [facility]  REVIEW OF SYSTEMS:  GENERAL: no change in appetite, no fatigue, no weight changes, no fever, chills or weakness RESPIRATORY: no cough, SOB, DOE, wheezing, hemoptysis CARDIAC: no chest pain, edema or palpitations GI: no abdominal pain, diarrhea, constipation, heart burn, nausea or vomiting  PHYSICAL EXAMINATION  VS:  T97.5       P84    RR18      BP136/90     POX93 %     WT136.4 (Lb)  GENERAL: no acute distress, moderately obese body habitus NECK: supple, trachea midline, no neck masses, no thyroid tenderness, no thyromegaly RESPIRATORY: breathing is even & unlabored, BS CTAB CARDIAC: RRR, no murmur,no extra heart sounds, no edema GI: abdomen soft, normal BS, no masses, no tenderness, no hepatomegaly, no splenomegaly NEUROLOGICAL: Right hemiparesis PSYCHIATRIC: the patient is alert & oriented to person, affect & behavior appropriate  LABS/RADIOLOGY: 8-14  albumin 3.2 otherwise he will profile normal, Depakote level 13.8 6/14 CBC normal 4/14 BNP normal  ASSESSMENT/PLAN:  Hyperlipidemia-well controlled Dementia-stable Constipation-well-controlled GERD-stable Anxiety-stable Depression-stable Vitamin D deficiency - continue supplementation Right hemiparesis - continue rehabilitation CVA, history - stable   CPT CODE: 82956

## 2013-02-20 ENCOUNTER — Non-Acute Institutional Stay (SKILLED_NURSING_FACILITY): Payer: Medicare Other | Admitting: Adult Health

## 2013-02-20 DIAGNOSIS — F411 Generalized anxiety disorder: Secondary | ICD-10-CM

## 2013-02-20 DIAGNOSIS — Z8673 Personal history of transient ischemic attack (TIA), and cerebral infarction without residual deficits: Secondary | ICD-10-CM

## 2013-02-20 DIAGNOSIS — F329 Major depressive disorder, single episode, unspecified: Secondary | ICD-10-CM

## 2013-02-20 DIAGNOSIS — F03918 Unspecified dementia, unspecified severity, with other behavioral disturbance: Secondary | ICD-10-CM

## 2013-02-20 DIAGNOSIS — F32A Depression, unspecified: Secondary | ICD-10-CM

## 2013-02-20 DIAGNOSIS — F419 Anxiety disorder, unspecified: Secondary | ICD-10-CM

## 2013-02-20 DIAGNOSIS — F0391 Unspecified dementia with behavioral disturbance: Secondary | ICD-10-CM

## 2013-02-20 DIAGNOSIS — E559 Vitamin D deficiency, unspecified: Secondary | ICD-10-CM

## 2013-02-20 DIAGNOSIS — K5900 Constipation, unspecified: Secondary | ICD-10-CM

## 2013-02-20 DIAGNOSIS — E785 Hyperlipidemia, unspecified: Secondary | ICD-10-CM

## 2013-02-20 NOTE — Progress Notes (Signed)
Patient ID: Mallory Herring, female   DOB: 1931-03-13, 79 y.o.   MRN: [id number]       PROGRESS NOTE  DATE: 02/20/13  FACILITY: Nursing Home Location: [facility]  LEVEL OF CARE: SNF (31)  Routine Visit  CHIEF COMPLAINT:  Manage Depression, Constipation, dementia and hyperlipidemia  HISTORY OF PRESENT ILLNESS:  REASSESSMENT OF ONGOING PROBLEM(S):  HYPERLIPIDEMIA: No complications from the medications presently being used. 5/14  fasting lipid panel showed : cholesterol 130  Trig 128  HDL 36  LDL68  DEPRESSION: The depression remains stable. Patient denies ongoing feelings of sadness, insomnia, anedhonia or lack of appetite. No complications reported from the medications currently being used. Staff do not report behavioral problems.  CVA: The patient's CVA remains stable.  Patient denies new neurologic symptoms such as numbness, tingling, weakness, speech difficulties or visual disturbances.  No complications reported from the medications currently being used.    PAST MEDICAL HISTORY : Reviewed.  No changes.  CURRENT MEDICATIONS: Reviewed per [facility]  REVIEW OF SYSTEMS:  GENERAL: no change in appetite, no fatigue, no weight changes, no fever, chills or weakness RESPIRATORY: no cough, SOB, DOE, wheezing, hemoptysis CARDIAC: no chest pain, edema or palpitations GI: no abdominal pain, diarrhea, constipation, heart burn, nausea or vomiting  PHYSICAL EXAMINATION  VS:  T97.1      P62   RR20      BP109/62     POX93 %     WT135.4 (Lb)  GENERAL: no acute distress, moderately obese body habitus NECK: supple, trachea midline, no neck masses, no thyroid tenderness, no thyromegaly LYMPHATICS: no LAN in the neck, no supraclavicular LAN RESPIRATORY: breathing is even & unlabored, BS CTAB CARDIAC: RRR, no murmur,no extra heart sounds, no edema GI: abdomen soft, normal BS, no masses, no tenderness, no hepatomegaly, no splenomegaly NEUROLOGICAL: Right hemiparesis PSYCHIATRIC: the  patient is alert & oriented to person, affect & behavior appropriate  LABS/RADIOLOGY: 12/16/12 WBC 6.4 hemoglobin 13.0 hematocrit 43.0 8-14 albumin 3.2 otherwise he will profile normal, Depakote level 13.8 6/14 CBC normal 4/14 BNP normal  ASSESSMENT/PLAN:  Hyperlipidemia-well controlled; continue Lipitor Dementia-stable; continue Aricept Constipation-well-controlled; continue Senna Anxiety-stable; continue Ativan Depression-stable; continue Zoloft Vitamin D deficiency - continue supplementation Right hemiparesis - continue rehabilitation CVA, history - stable   CPT CODE: 13086

## 2013-03-21 ENCOUNTER — Non-Acute Institutional Stay (SKILLED_NURSING_FACILITY): Payer: Medicare Other | Admitting: Adult Health

## 2013-03-21 DIAGNOSIS — Z8673 Personal history of transient ischemic attack (TIA), and cerebral infarction without residual deficits: Secondary | ICD-10-CM

## 2013-03-21 DIAGNOSIS — E559 Vitamin D deficiency, unspecified: Secondary | ICD-10-CM

## 2013-03-21 DIAGNOSIS — F411 Generalized anxiety disorder: Secondary | ICD-10-CM

## 2013-03-21 DIAGNOSIS — F419 Anxiety disorder, unspecified: Secondary | ICD-10-CM

## 2013-03-21 DIAGNOSIS — F0391 Unspecified dementia with behavioral disturbance: Secondary | ICD-10-CM

## 2013-03-21 DIAGNOSIS — F329 Major depressive disorder, single episode, unspecified: Secondary | ICD-10-CM

## 2013-03-21 DIAGNOSIS — K5900 Constipation, unspecified: Secondary | ICD-10-CM

## 2013-03-21 DIAGNOSIS — E785 Hyperlipidemia, unspecified: Secondary | ICD-10-CM

## 2013-03-21 DIAGNOSIS — F32A Depression, unspecified: Secondary | ICD-10-CM

## 2013-03-21 DIAGNOSIS — F03918 Unspecified dementia, unspecified severity, with other behavioral disturbance: Secondary | ICD-10-CM

## 2013-03-21 NOTE — Progress Notes (Signed)
Patient ID: Mallory Herring, female   DOB: 1930/12/25, 79 y.o.   MRN: [id number]                     PROGRESS NOTE  DATE: 03/21/13  FACILITY: Nursing Home Location: [facility]  LEVEL OF CARE: SNF (31)  Routine Visit  CHIEF COMPLAINT:  Manage Depression, Constipation, dementia and hyperlipidemia  HISTORY OF PRESENT ILLNESS:  REASSESSMENT OF ONGOING PROBLEM(S):  DEMENTIA: The dementia remaines stable and continues to function adequately in the current living environment with supervision.  The patient has had little changes in behavior. No complications noted from the medications presently being used.  ANXIETY: The anxiety remains stable. Patient denies ongoing anxiety or irritability. No complications reported from the medications currently being used.  CONSTIPATION: The constipation remains stable. No complications from the medications presently being used. Patient denies ongoing constipation, abdominal pain, nausea or vomiting.   PAST MEDICAL HISTORY : Reviewed.  No changes.  CURRENT MEDICATIONS: Reviewed per [facility]  REVIEW OF SYSTEMS:  GENERAL: no change in appetite, no fatigue, no weight changes, no fever, chills or weakness RESPIRATORY: no cough, SOB, DOE, wheezing, hemoptysis CARDIAC: no chest pain, edema or palpitations GI: no abdominal pain, diarrhea, constipation, heart burn, nausea or vomiting  PHYSICAL EXAMINATION  VS:  T96.7      P76    RR16    BP134/83     POX93 %     WT138.2 (Lb)  GENERAL: no acute distress, moderately obese body habitus NECK: supple, trachea midline, no neck masses, no thyroid tenderness, no thyromegaly RESPIRATORY: breathing is even & unlabored, BS CTAB CARDIAC: RRR, no murmur,no extra heart sounds, no edema GI: abdomen soft, normal BS, no masses, no tenderness, no hepatomegaly, no splenomegaly NEUROLOGICAL: Right hemiparesis PSYCHIATRIC: the patient is alert & oriented to person, affect & behavior  appropriate  LABS/RADIOLOGY: 12/16/12 WBC 6.4 hemoglobin 13.0 hematocrit 43.0 8-14 albumin 3.2 otherwise he will profile normal, Depakote level 13.8 6/14 CBC normal 4/14 BNP normal  ASSESSMENT/PLAN:  Hyperlipidemia-well controlled; continue Lipitor Dementia with behavioral disturbance - stable; continue Aricept andDepakote Constipation-well-controlled; continue Senna Anxiety-stable; continue Ativan Depression-stable; continue Zoloft Vitamin D deficiency - continue supplementation CVA, history - stable   CPT CODE: 13086

## 2013-04-04 ENCOUNTER — Non-Acute Institutional Stay (SKILLED_NURSING_FACILITY): Payer: Medicare Other | Admitting: Adult Health

## 2013-04-04 DIAGNOSIS — J40 Bronchitis, not specified as acute or chronic: Secondary | ICD-10-CM

## 2013-04-25 ENCOUNTER — Non-Acute Institutional Stay (SKILLED_NURSING_FACILITY): Payer: Medicare Other | Admitting: Adult Health

## 2013-04-25 DIAGNOSIS — F3289 Other specified depressive episodes: Secondary | ICD-10-CM

## 2013-04-25 DIAGNOSIS — K5900 Constipation, unspecified: Secondary | ICD-10-CM

## 2013-04-25 DIAGNOSIS — E785 Hyperlipidemia, unspecified: Secondary | ICD-10-CM

## 2013-04-25 DIAGNOSIS — F0391 Unspecified dementia with behavioral disturbance: Secondary | ICD-10-CM

## 2013-04-25 DIAGNOSIS — F03918 Unspecified dementia, unspecified severity, with other behavioral disturbance: Secondary | ICD-10-CM

## 2013-04-25 DIAGNOSIS — F32A Depression, unspecified: Secondary | ICD-10-CM

## 2013-04-25 DIAGNOSIS — Z8673 Personal history of transient ischemic attack (TIA), and cerebral infarction without residual deficits: Secondary | ICD-10-CM

## 2013-04-25 DIAGNOSIS — F419 Anxiety disorder, unspecified: Secondary | ICD-10-CM

## 2013-04-25 DIAGNOSIS — F411 Generalized anxiety disorder: Secondary | ICD-10-CM

## 2013-04-25 DIAGNOSIS — K219 Gastro-esophageal reflux disease without esophagitis: Secondary | ICD-10-CM

## 2013-04-25 DIAGNOSIS — F329 Major depressive disorder, single episode, unspecified: Secondary | ICD-10-CM

## 2013-04-26 DIAGNOSIS — J40 Bronchitis, not specified as acute or chronic: Secondary | ICD-10-CM | POA: Insufficient documentation

## 2013-04-26 DIAGNOSIS — F0390 Unspecified dementia without behavioral disturbance: Secondary | ICD-10-CM | POA: Insufficient documentation

## 2013-04-26 DIAGNOSIS — E785 Hyperlipidemia, unspecified: Secondary | ICD-10-CM | POA: Insufficient documentation

## 2013-04-26 NOTE — Progress Notes (Signed)
Patient ID: Mallory Herring, female   DOB: Jun 18, 1930, 80 y.o.   MRN: [id number]                     PROGRESS NOTE  DATE: 04/25/13  FACILITY: Nursing Home Location: [facility]  LEVEL OF CARE: SNF (31)  Routine Visit  CHIEF COMPLAINT:  Manage Depression, Constipation, dementia and hyperlipidemia  HISTORY OF PRESENT ILLNESS:  REASSESSMENT OF ONGOING PROBLEM(S):  HYPERLIPIDEMIA: No complications from the medications presently being used. 5/14 fasting lipid panel showed : Cholesterol 130 triglyceride 128 HDL 36 LDL 68  GERD: pt's GERD is stable.  Denies ongoing heartburn, abd. Pain, nausea or vomiting.  Currently on a PPI & tolerates it without any adverse reactions.  DEPRESSION: The depression remains stable. Patient denies ongoing feelings of sadness, insomnia, anedhonia or lack of appetite. No complications reported from the medications currently being used. Staff do not report behavioral problems.   PAST MEDICAL HISTORY : Reviewed.  No changes.  CURRENT MEDICATIONS: Reviewed per [facility]  REVIEW OF SYSTEMS:  GENERAL: no change in appetite, no fatigue, no weight changes, no fever, chills or weakness RESPIRATORY: no cough, SOB, DOE, wheezing, hemoptysis CARDIAC: no chest pain, edema or palpitations GI: no abdominal pain, diarrhea, constipation, heart burn, nausea or vomiting  PHYSICAL EXAMINATION  VS:  T97.9     P80   RR20   BP111/77    POX95 %     WT136.8 (Lb)  GENERAL: no acute distress, moderately obese body habitus EYES: Lids open and close normally, PERRL, conjunctivae are normal  NECK: supple, trachea midline, no neck masses, no thyroid tenderness, no thyromegaly RESPIRATORY: breathing is even & unlabored, BS CTAB CARDIAC: RRR, no murmur,no extra heart sounds, no edema GI: abdomen soft, normal BS, no masses, no tenderness, no hepatomegaly, no splenomegaly NEUROLOGICAL: Right hemiparesis PSYCHIATRIC: the patient is alert & oriented to person, affect &  behavior appropriate  LABS/RADIOLOGY: 03/26/13 WBC 5.3 hemoglobin 12.1 hematocrit 41.0 03/25/13 sodium 139 potassium 4.0 glucose 84 BUN 18 creatinine 0.8 calcium 8.6 03/24/13 chest x-ray she shows COPD, no CHF nor infiltrates 12/16/12 WBC 6.4 hemoglobin 13.0 hematocrit 43.0 8-14 albumin 3.2 otherwise he will profile normal, Depakote level 13.8 6/14 CBC normal 4/14 BNP normal  ASSESSMENT/PLAN:  Hyperlipidemia-well controlled; continue Lipitor Dementia with behavioral disturbance - stable; continue Aricept and Depakote Constipation-well-controlled; continue Senna Anxiety-stable; continue Ativan Depression-stable; continue Zoloft Vitamin D deficiency - continue supplementation CVA, history - stable GERD - stable; continue Pepcid   CPT CODE: 79150

## 2013-04-26 NOTE — Progress Notes (Signed)
Patient ID: Mallory Herring, female   DOB: 1931-01-10, 80 y.o.   MRN: [id number]                    PROGRESS NOTE  DATE: 04/04/13  FACILITY: Nursing Home Location: [facility]  LEVEL OF CARE: SNF (31)  Acute Visit  CHIEF COMPLAINT:  Manage Bronchitis  HISTORY OF PRESENT ILLNESS: This is an 80 year old female who was noted to have productive cough, body malaise and poor appetite. Nasal swab for influenza is negative.     PAST MEDICAL HISTORY : Reviewed.  No changes.  CURRENT MEDICATIONS: Reviewed per [facility]  REVIEW OF SYSTEMS:  GENERAL: no  fatigue, no weight changes, no fever, chills or weakness, +poor appetite RESPIRATORY: no SOB, DOE, wheezing, hemoptysis, +cough CARDIAC: no chest pain, edema or palpitations GI: no abdominal pain, diarrhea, constipation, heart burn, nausea or vomiting  PHYSICAL EXAMINATION  VS:  T96.7     P77    RR20    BP139/82     POX93 %     WT136.8 (Lb)  GENERAL: no acute distress, moderately obese body habitus NECK: supple, trachea midline, no neck masses, no thyroid tenderness, no thyromegaly RESPIRATORY: breathing is even & unlabored, decreased breath sounds bilaterally CARDIAC: RRR, no murmur,no extra heart sounds, no edema GI: abdomen soft, normal BS, no masses, no tenderness, no hepatomegaly, no splenomegaly NEUROLOGICAL: Right hemiparesis PSYCHIATRIC: the patient is alert & oriented to person, affect & behavior appropriate  LABS/RADIOLOGY: 12/16/12 WBC 6.4 hemoglobin 13.0 hematocrit 43.0 8-14 albumin 3.2 otherwise he will profile normal, Depakote level 13.8 6/14 CBC normal 4/14 BNP normal  ASSESSMENT/PLAN:  Bronchitis - start Doxycycline 100 mg PO BID x 10 days; start albuterol 2.5 mg/69ml 1 neb Q 6AM, 2PM and 10PM x 5 days; and Mucinex 600 mg PO Q 12 hours x 2 weeks  CPT CODE: 51025

## 2013-05-27 ENCOUNTER — Encounter: Payer: Self-pay | Admitting: *Deleted

## 2013-05-28 ENCOUNTER — Non-Acute Institutional Stay (SKILLED_NURSING_FACILITY): Payer: Medicare Other | Admitting: Adult Health

## 2013-05-28 DIAGNOSIS — F411 Generalized anxiety disorder: Secondary | ICD-10-CM

## 2013-05-28 DIAGNOSIS — F3289 Other specified depressive episodes: Secondary | ICD-10-CM

## 2013-05-28 DIAGNOSIS — E785 Hyperlipidemia, unspecified: Secondary | ICD-10-CM

## 2013-05-28 DIAGNOSIS — F0391 Unspecified dementia with behavioral disturbance: Secondary | ICD-10-CM

## 2013-05-28 DIAGNOSIS — K5900 Constipation, unspecified: Secondary | ICD-10-CM

## 2013-05-28 DIAGNOSIS — F419 Anxiety disorder, unspecified: Secondary | ICD-10-CM

## 2013-05-28 DIAGNOSIS — F32A Depression, unspecified: Secondary | ICD-10-CM

## 2013-05-28 DIAGNOSIS — F329 Major depressive disorder, single episode, unspecified: Secondary | ICD-10-CM

## 2013-05-28 DIAGNOSIS — Z8673 Personal history of transient ischemic attack (TIA), and cerebral infarction without residual deficits: Secondary | ICD-10-CM

## 2013-05-28 DIAGNOSIS — K219 Gastro-esophageal reflux disease without esophagitis: Secondary | ICD-10-CM

## 2013-05-28 DIAGNOSIS — F03918 Unspecified dementia, unspecified severity, with other behavioral disturbance: Secondary | ICD-10-CM

## 2013-07-09 ENCOUNTER — Non-Acute Institutional Stay (SKILLED_NURSING_FACILITY): Payer: Medicare Other | Admitting: Adult Health

## 2013-07-09 ENCOUNTER — Encounter: Payer: Self-pay | Admitting: Adult Health

## 2013-07-09 DIAGNOSIS — F3289 Other specified depressive episodes: Secondary | ICD-10-CM

## 2013-07-09 DIAGNOSIS — Z8673 Personal history of transient ischemic attack (TIA), and cerebral infarction without residual deficits: Secondary | ICD-10-CM

## 2013-07-09 DIAGNOSIS — F419 Anxiety disorder, unspecified: Secondary | ICD-10-CM

## 2013-07-09 DIAGNOSIS — F0391 Unspecified dementia with behavioral disturbance: Secondary | ICD-10-CM

## 2013-07-09 DIAGNOSIS — E785 Hyperlipidemia, unspecified: Secondary | ICD-10-CM

## 2013-07-09 DIAGNOSIS — F411 Generalized anxiety disorder: Secondary | ICD-10-CM

## 2013-07-09 DIAGNOSIS — K219 Gastro-esophageal reflux disease without esophagitis: Secondary | ICD-10-CM

## 2013-07-09 DIAGNOSIS — K5900 Constipation, unspecified: Secondary | ICD-10-CM

## 2013-07-09 DIAGNOSIS — F329 Major depressive disorder, single episode, unspecified: Secondary | ICD-10-CM

## 2013-07-09 DIAGNOSIS — F32A Depression, unspecified: Secondary | ICD-10-CM

## 2013-07-09 DIAGNOSIS — F03918 Unspecified dementia, unspecified severity, with other behavioral disturbance: Secondary | ICD-10-CM

## 2013-07-09 NOTE — Progress Notes (Signed)
Patient ID: Mallory Herring, female   DOB: Aug 20, 1930, 80 y.o.   MRN: [id number]                     PROGRESS NOTE  DATE: 05/28/13  FACILITY: Nursing Home Location: [facility] and Re[facility]  LEVEL OF CARE: SNF (31)  Routine Visit  CHIEF COMPLAINT:  Manage Depression, Constipation, dementia and hyperlipidemia  HISTORY OF PRESENT ILLNESS:  REASSESSMENT OF ONGOING PROBLEM(S):  DEMENTIA: The dementia remaines stable and continues to function adequately in the current living environment with supervision.  The patient has had little changes in behavior. No complications noted from the medications presently being used.  ANXIETY: The anxiety remains stable. Patient denies ongoing anxiety or irritability. No complications reported from the medications currently being used.  CVA: The patient's CVA remains stable.  Patient denies new neurologic symptoms such as numbness, tingling, weakness, speech difficulties or visual disturbances.  No complications reported from the medications currently being used.   PAST MEDICAL HISTORY : Reviewed.  No changes.  CURRENT MEDICATIONS: Reviewed per [facility]  REVIEW OF SYSTEMS:  GENERAL: no change in appetite, no fatigue, no weight changes, no fever, chills or weakness RESPIRATORY: no cough, SOB, DOE, wheezing, hemoptysis CARDIAC: no chest pain, edema or palpitations GI: no abdominal pain, diarrhea, constipation, heart burn, nausea or vomiting  PHYSICAL EXAMINATION  GENERAL: no acute distress, moderately obese body habitus   NECK: supple, trachea midline, no neck masses, no thyroid tenderness, no thyromegaly RESPIRATORY: breathing is even & unlabored, BS CTAB CARDIAC: RRR, no murmur,no extra heart sounds, no edema GI: abdomen soft, normal BS, no masses, no tenderness, no hepatomegaly, no splenomegaly NEUROLOGICAL: Right hemiparesis PSYCHIATRIC: the patient is alert & oriented to person, affect & behavior appropriate  LABS/RADIOLOGY: 03/26/13 WBC 5.3  hemoglobin 12.1 hematocrit 41.0 03/25/13 sodium 139 potassium 4.0 glucose 84 BUN 18 creatinine 0.8 calcium 8.6 03/24/13 chest x-ray she shows COPD, no CHF nor infiltrates 12/16/12 WBC 6.4 hemoglobin 13.0 hematocrit 43.0 8-14 albumin 3.2 otherwise he will profile normal, Depakote level 13.8 6/14 CBC normal 4/14 BNP normal  ASSESSMENT/PLAN:  Hyperlipidemia-well controlled; continue Lipitor Dementia with behavioral disturbance - stable; continue Aricept and Depakote Constipation-well-controlled; continue Senna Anxiety-stable; recently decreased Ativan dosage Depression-stable; continue Zoloft Vitamin D deficiency - continue supplementation CVA, history - stable GERD - stable; continue Pepcid   CPT CODE: 96789  Ella Bodo [facility] [phone]

## 2013-07-09 NOTE — Progress Notes (Signed)
Patient ID: Mallory Herring, female   DOB: 1930/08/04, 80 y.o.   MRN: [id number]          PROGRESS NOTE  DATE: 07/09/13  FACILITY: Nursing Home Location: [facility]  LEVEL OF CARE: SNF (31)  Routine Visit  CHIEF COMPLAINT:  Manage Depression, Constipation, dementia and hyperlipidemia  HISTORY OF PRESENT ILLNESS:  REASSESSMENT OF ONGOING PROBLEM(S):  GERD: pt's GERD is stable.  Denies ongoing heartburn, abd. Pain, nausea or vomiting.  Currently on a PPI & tolerates it without any adverse reactions.  CONSTIPATION: The constipation remains stable. No complications from the medications presently being used. Patient denies ongoing constipation, abdominal pain, nausea or vomiting.  DEMENTIA: The dementia remaines stable and continues to function adequately in the current living environment with supervision.  The patient has had little changes in behavior. No complications noted from the medications presently being used.   PAST MEDICAL HISTORY : Reviewed.  No changes.  CURRENT MEDICATIONS: Reviewed per [facility]  REVIEW OF SYSTEMS:  GENERAL: no change in appetite, no fatigue, no weight changes, no fever, chills or weakness RESPIRATORY: no cough, SOB, DOE, wheezing, hemoptysis CARDIAC: no chest pain, edema or palpitations GI: no abdominal pain, diarrhea, constipation, heart burn, nausea or vomiting  PHYSICAL EXAMINATION  GENERAL: no acute distress, moderately obese body habitus   NECK: supple, trachea midline, no neck masses, no thyroid tenderness, no thyromegaly LYMPHATIC: No adenopathy in the cervical, supra-clavicular and axillary area RESPIRATORY: breathing is even & unlabored, BS CTAB CARDIAC: RRR, no murmur,no extra heart sounds, no edema GI: abdomen soft, normal BS, no masses, no tenderness, no hepatomegaly, no splenomegaly NEUROLOGICAL: Right hemiparesis PSYCHIATRIC: the patient is alert & oriented to person, affect & behavior  appropriate  LABS/RADIOLOGY: 06/03/13 sodium 138 potassium 3.8 glucose 100 BUN 20 creatinine 1.0 calcium 8.9 05/27/13 sodium 140 potassium 3.3 glucose 150 BUN 15 creatinine 0.9 calcium 8.1 WBC 6.9 hemoglobin 10.8 hematocrit 36.7 03/26/13 WBC 5.3 hemoglobin 12.1 hematocrit 41.0 03/25/13 sodium 139 potassium 4.0 glucose 84 BUN 18 creatinine 0.8 calcium 8.6 03/24/13 chest x-ray she shows COPD, no CHF nor infiltrates 12/16/12 WBC 6.4 hemoglobin 13.0 hematocrit 43.0 8-14 albumin 3.2 otherwise he will profile normal, Depakote level 13.8 6/14 CBC normal 4/14 BNP normal  ASSESSMENT/PLAN:  Hyperlipidemia-well controlled; continue Lipitor Dementia with behavioral disturbance - stable; continue Aricept and Depakote Constipation-well-controlled; continue Senna Anxiety-stable; continue Ativan Depression-stable; continue Zoloft Vitamin D deficiency - continue supplementation CVA, history - stable; continue ASA GERD - stable; continue Pepcid   CPT CODE: 40981  Ella Bodo [facility] [phone]

## 2013-07-31 ENCOUNTER — Non-Acute Institutional Stay (SKILLED_NURSING_FACILITY): Payer: PRIVATE HEALTH INSURANCE | Admitting: Internal Medicine

## 2013-07-31 DIAGNOSIS — E785 Hyperlipidemia, unspecified: Secondary | ICD-10-CM

## 2013-07-31 DIAGNOSIS — F0390 Unspecified dementia without behavioral disturbance: Secondary | ICD-10-CM | POA: Insufficient documentation

## 2013-07-31 DIAGNOSIS — K5900 Constipation, unspecified: Secondary | ICD-10-CM

## 2013-07-31 DIAGNOSIS — D638 Anemia in other chronic diseases classified elsewhere: Secondary | ICD-10-CM

## 2013-07-31 NOTE — Addendum Note (Signed)
Addended by: Angela Cox on: 07/31/2013 04:34 PM   Modules accepted: Level of Service

## 2013-07-31 NOTE — Progress Notes (Addendum)
        PROGRESS NOTE  DATE: 07-31-13  FACILITY: Nursing Home Location: [facility]  LEVEL OF CARE: SNF (31)  Routine Visit  CHIEF COMPLAINT:  Manage anemia of chronic disease, dementia and hyperlipidemia  HISTORY OF PRESENT ILLNESS:  REASSESSMENT OF ONGOING PROBLEM(S):  ANEMIA: The anemia has been stable. The patient denies fatigue, melena or hematochezia. No complications from the medications currently being used. In 6/14 hemoglobin 12.4, in 3-15 hemoglobin 10.8, MCV 87.2.  DEMENTIA: The dementia remaines stable and continues to function adequately in the current living environment with supervision.  The patient has had little changes in behavior. No complications noted from the medications presently being used.  HYPERLIPIDEMIA: No complications from the medications presently being used. Last fasting lipid panel showed : In 5/14 HDL 36 otherwise FastIn lipid panel normal  PAST MEDICAL HISTORY : Reviewed.  No changes.  CURRENT MEDICATIONS: Reviewed per [facility]  REVIEW OF SYSTEMS:  GENERAL: no change in appetite, no fatigue, no weight changes, no fever, chills or weakness RESPIRATORY: no cough, SOB, DOE, wheezing, hemoptysis CARDIAC: no chest pain, edema or palpitations GI: no abdominal pain, diarrhea, constipation, heart burn, nausea or vomiting  PHYSICAL EXAMINATION  VS: See vital signs section  GENERAL: no acute distress, moderately obese body habitus EYES: Normal sclerae, normal conjunctivae, no discharge NECK: supple, trachea midline, no neck masses, no thyroid tenderness, no thyromegaly LYMPHATICS: No cervical lymphadenopathy, no supraclavicular lymphadenopathy RESPIRATORY: breathing is even & unlabored, BS CTAB CARDIAC: RRR, no murmur,no extra heart sounds, no edema GI: abdomen soft, normal BS, no masses, no tenderness, no hepatomegaly, no splenomegaly PSYCHIATRIC: the patient is alert & oriented to person, affect & behavior  appropriate  LABS/RADIOLOGY: 3-15 BMP normal, hemoglobin 10.8, MCV 7.2 otherwise CBC normal 8-14 albumin 3.2 otherwise he will profile normal, Depakote level 13.8  6/14 CBC normal 4/14 BNP normal  ASSESSMENT/PLAN:  Anemia of chronic disease-hemoglobin has declined. Will monitor. Hyperlipidemia-well controlled. Check fasting lipid panel. Dementia-stable Constipation-well-controlled GERD-stable Anxiety-stable Depression-stable UTI-started on Cipro Check liver profile and Depakote level  CPT CODE: 72902  Newton Pigg. Kerry Dory, MD [facility] [phone]

## 2013-08-01 NOTE — Addendum Note (Signed)
Addended by: Angela Cox on: 08/01/2013 11:50 AM   Modules accepted: Level of Service

## 2013-08-28 ENCOUNTER — Non-Acute Institutional Stay (SKILLED_NURSING_FACILITY): Payer: PRIVATE HEALTH INSURANCE | Admitting: Internal Medicine

## 2013-08-28 DIAGNOSIS — D638 Anemia in other chronic diseases classified elsewhere: Secondary | ICD-10-CM

## 2013-08-28 DIAGNOSIS — F0390 Unspecified dementia without behavioral disturbance: Secondary | ICD-10-CM

## 2013-08-28 DIAGNOSIS — K5900 Constipation, unspecified: Secondary | ICD-10-CM

## 2013-08-28 DIAGNOSIS — E785 Hyperlipidemia, unspecified: Secondary | ICD-10-CM

## 2013-08-29 NOTE — Progress Notes (Signed)
         PROGRESS NOTE  DATE: 08-28-13  FACILITY: Nursing Home Location: [facility]  LEVEL OF CARE: SNF (31)  Routine Visit  CHIEF COMPLAINT:  Manage anemia of chronic disease, dementia and hyperlipidemia  HISTORY OF PRESENT ILLNESS:  REASSESSMENT OF ONGOING PROBLEM(S):  ANEMIA: The anemia has been stable. The patient denies fatigue, melena or hematochezia. No complications from the medications currently being used. In 6/14 hemoglobin 12.4, in 3-15 hemoglobin 10.8, MCV 87.2, in 5-15 hemoglobin 12.9, MCV 86.9..  DEMENTIA: The dementia remaines stable and continues to function adequately in the current living environment with supervision.  The patient has had little changes in behavior. No complications noted from the medications presently being used.  HYPERLIPIDEMIA: No complications from the medications presently being used. Last fasting lipid panel showed : In 5/14 HDL 36 otherwise FastIn lipid panel normal, in 5-15 fasting lipid panel normal  PAST MEDICAL HISTORY : Reviewed.  No changes.  CURRENT MEDICATIONS: Reviewed per [facility]  REVIEW OF SYSTEMS:  GENERAL: no change in appetite, no fatigue, no weight changes, no fever, chills or weakness RESPIRATORY: no cough, SOB, DOE, wheezing, hemoptysis CARDIAC: no chest pain, edema or palpitations GI: no abdominal pain, diarrhea, constipation, heart burn, nausea or vomiting  PHYSICAL EXAMINATION  VS: See vital signs section  GENERAL: no acute distress, moderately obese body habitus EYES: Normal sclerae, normal conjunctivae, no discharge NECK: supple, trachea midline, no neck masses, no thyroid tenderness, no thyromegaly LYMPHATICS: No cervical lymphadenopathy, no supraclavicular lymphadenopathy RESPIRATORY: breathing is even & unlabored, BS CTAB CARDIAC: RRR, no murmur,no extra heart sounds, no edema GI: abdomen soft, normal BS, no masses, no tenderness, no hepatomegaly, no splenomegaly PSYCHIATRIC: the patient is  alert & oriented to person, affect & behavior appropriate  LABS/RADIOLOGY: 5-15 CBC normal, total protein 5.9, albumin 3.2 otherwise liver profile normal, Depakote level 24 3-15 BMP normal, hemoglobin 10.8, MCV 7.2 otherwise CBC normal 8-14 albumin 3.2 otherwise he will profile normal, Depakote level 13.8  6/14 CBC normal 4/14 BNP normal  ASSESSMENT/PLAN:  Anemia of chronic disease-hemoglobin normalized Hyperlipidemia-well controlled. Dementia-stable Constipation-well-controlled GERD-stable Anxiety-stable Depression-stable  CPT CODE: 75449  Newton Pigg. Kerry Dory, MD [facility] [phone]

## 2013-09-01 ENCOUNTER — Encounter: Payer: Self-pay | Admitting: *Deleted

## 2013-10-15 ENCOUNTER — Non-Acute Institutional Stay (SKILLED_NURSING_FACILITY): Payer: PRIVATE HEALTH INSURANCE | Admitting: Internal Medicine

## 2013-10-15 DIAGNOSIS — F0390 Unspecified dementia without behavioral disturbance: Secondary | ICD-10-CM

## 2013-10-15 DIAGNOSIS — D638 Anemia in other chronic diseases classified elsewhere: Secondary | ICD-10-CM

## 2013-10-15 DIAGNOSIS — K5900 Constipation, unspecified: Secondary | ICD-10-CM

## 2013-10-15 DIAGNOSIS — E785 Hyperlipidemia, unspecified: Secondary | ICD-10-CM

## 2013-10-15 NOTE — Progress Notes (Signed)
        PROGRESS NOTE  DATE: 10-15-13  FACILITY: Nursing Home Location: [facility]  LEVEL OF CARE: SNF (31)  Routine Visit  CHIEF COMPLAINT:  Manage anemia of chronic disease, dementia and hyperlipidemia  HISTORY OF PRESENT ILLNESS:  REASSESSMENT OF ONGOING PROBLEM(S):  ANEMIA: The anemia has been stable. The patient denies fatigue, melena or hematochezia. No complications from the medications currently being used. In 6/14 hemoglobin 12.4, in 3-15 hemoglobin 10.8, MCV 87.2, in 5-15 hemoglobin 12.9, MCV 86.9, in 7-15 hemoglobin 11.1, MCV 86.4.Marland Kitchen  DEMENTIA: The dementia remaines stable and continues to function adequately in the current living environment with supervision.  The patient has had little changes in behavior. No complications noted from the medications presently being used.  HYPERLIPIDEMIA: No complications from the medications presently being used. Last fasting lipid panel showed : In 5/14 HDL 36 otherwise FastIn lipid panel normal, in 5-15 fasting lipid panel normal  PAST MEDICAL HISTORY : Reviewed.  No changes.  CURRENT MEDICATIONS: Reviewed per [facility]  REVIEW OF SYSTEMS:  GENERAL: no change in appetite, no fatigue, no weight changes, no fever, chills or weakness RESPIRATORY: no cough, SOB, DOE, wheezing, hemoptysis CARDIAC: no chest pain, edema or palpitations GI: no abdominal pain, diarrhea, constipation, heart burn, nausea or vomiting  PHYSICAL EXAMINATION  VS: See vital signs section  GENERAL: no acute distress, moderately obese body habitus EYES: Normal sclerae, normal conjunctivae, no discharge NECK: supple, trachea midline, no neck masses, no thyroid tenderness, no thyromegaly LYMPHATICS: No cervical lymphadenopathy, no supraclavicular lymphadenopathy RESPIRATORY: breathing is even & unlabored, BS CTAB CARDIAC: RRR, no murmur,no extra heart sounds, no edema GI: abdomen soft, normal BS, no masses, no tenderness, no hepatomegaly, no  splenomegaly PSYCHIATRIC: the patient is alert & oriented to person, affect & behavior appropriate  LABS/RADIOLOGY: 7-15 hemoglobin 11.1, MCV 86.4 otherwise CBC normal, BMP normal 5-15 CBC normal, total protein 5.9, albumin 3.2 otherwise liver profile normal, Depakote level 24 3-15 BMP normal, hemoglobin 10.8, MCV 7.2 otherwise CBC normal 8-14 albumin 3.2 otherwise he will profile normal, Depakote level 13.8  6/14 CBC normal 4/14 BNP normal  ASSESSMENT/PLAN:  Anemia of chronic disease-stable Hyperlipidemia-well controlled. Dementia-stable Constipation-well-controlled GERD-stable Anxiety-stable Depression-stable  CPT CODE: 93734  Newton Pigg. Kerry Dory, MD [facility] [phone]

## 2013-10-23 ENCOUNTER — Other Ambulatory Visit: Payer: Self-pay | Admitting: *Deleted

## 2013-10-23 MED ORDER — LORAZEPAM 0.5 MG PO TABS: ORAL_TABLET | ORAL | Status: DC

## 2013-10-23 NOTE — Telephone Encounter (Signed)
[facility] 

## 2013-11-13 ENCOUNTER — Non-Acute Institutional Stay (SKILLED_NURSING_FACILITY): Payer: PRIVATE HEALTH INSURANCE | Admitting: Internal Medicine

## 2013-11-13 DIAGNOSIS — F0390 Unspecified dementia without behavioral disturbance: Secondary | ICD-10-CM

## 2013-11-13 DIAGNOSIS — D638 Anemia in other chronic diseases classified elsewhere: Secondary | ICD-10-CM

## 2013-11-13 DIAGNOSIS — K5900 Constipation, unspecified: Secondary | ICD-10-CM

## 2013-11-13 DIAGNOSIS — E785 Hyperlipidemia, unspecified: Secondary | ICD-10-CM

## 2013-11-14 NOTE — Progress Notes (Signed)
        PROGRESS NOTE  DATE: 11-13-13  FACILITY: Nursing Home Location: [facility]  LEVEL OF CARE: SNF (31)  Routine Visit  CHIEF COMPLAINT:  Manage anemia of chronic disease, dementia and hyperlipidemia  HISTORY OF PRESENT ILLNESS:  REASSESSMENT OF ONGOING PROBLEM(S):  ANEMIA: The anemia has been stable. The patient denies fatigue, melena or hematochezia. No complications from the medications currently being used. In 6/14 hemoglobin 12.4, in 3-15 hemoglobin 10.8, MCV 87.2, in 5-15 hemoglobin 12.9, MCV 86.9, in 7-15 hemoglobin 11.1, MCV 86.4.Marland Kitchen  DEMENTIA: The dementia remaines stable and continues to function adequately in the current living environment with supervision.  The patient has had little changes in behavior. No complications noted from the medications presently being used.  HYPERLIPIDEMIA: No complications from the medications presently being used. Last fasting lipid panel showed : In 5/14 HDL 36 otherwise FastIn lipid panel normal, in 5-15 fasting lipid panel normal  PAST MEDICAL HISTORY : Reviewed.  No changes.  CURRENT MEDICATIONS: Reviewed per [facility]  REVIEW OF SYSTEMS:  GENERAL: no change in appetite, no fatigue, no weight changes, no fever, chills or weakness RESPIRATORY: no cough, SOB, DOE, wheezing, hemoptysis CARDIAC: no chest pain, edema or palpitations GI: no abdominal pain, diarrhea, constipation, heart burn, nausea or vomiting  PHYSICAL EXAMINATION  VS: See vital signs section  GENERAL: no acute distress, moderately obese body habitus EYES: Normal sclerae, normal conjunctivae, no discharge NECK: supple, trachea midline, no neck masses, no thyroid tenderness, no thyromegaly LYMPHATICS: No cervical lymphadenopathy, no supraclavicular lymphadenopathy RESPIRATORY: breathing is even & unlabored, BS CTAB CARDIAC: RRR, no murmur,no extra heart sounds, no edema GI: abdomen soft, normal BS, no masses, no tenderness, no hepatomegaly, no  splenomegaly PSYCHIATRIC: the patient is alert & oriented to person, affect & behavior appropriate  LABS/RADIOLOGY: 7-15 hemoglobin 11.1, MCV 86.4 otherwise CBC normal, BMP normal 5-15 CBC normal, total protein 5.9, albumin 3.2 otherwise liver profile normal, Depakote level 24 3-15 BMP normal, hemoglobin 10.8, MCV 7.2 otherwise CBC normal 8-14 albumin 3.2 otherwise he will profile normal, Depakote level 13.8  6/14 CBC normal 4/14 BNP normal  ASSESSMENT/PLAN:  Anemia of chronic disease-stable Hyperlipidemia-well controlled. Dementia-stable Constipation-well-controlled GERD-stable. Pepcid was decreased. Anxiety-stable Depression-stable  CPT CODE: 88891  Newton Pigg. Kerry Dory, MD [facility] [phone]

## 2013-12-04 ENCOUNTER — Non-Acute Institutional Stay (SKILLED_NURSING_FACILITY): Payer: PRIVATE HEALTH INSURANCE | Admitting: Internal Medicine

## 2013-12-04 DIAGNOSIS — F0390 Unspecified dementia without behavioral disturbance: Secondary | ICD-10-CM

## 2013-12-04 DIAGNOSIS — K5900 Constipation, unspecified: Secondary | ICD-10-CM

## 2013-12-04 DIAGNOSIS — D638 Anemia in other chronic diseases classified elsewhere: Secondary | ICD-10-CM

## 2013-12-04 DIAGNOSIS — E785 Hyperlipidemia, unspecified: Secondary | ICD-10-CM

## 2013-12-06 NOTE — Progress Notes (Signed)
        PROGRESS NOTE  DATE: 12-04-13  FACILITY: Nursing Home Location: [facility]  LEVEL OF CARE: SNF (31)  Routine Visit  CHIEF COMPLAINT:  Manage anemia of chronic disease, dementia and hyperlipidemia  HISTORY OF PRESENT ILLNESS:  REASSESSMENT OF ONGOING PROBLEM(S):  ANEMIA: The anemia has been stable. The patient denies fatigue, melena or hematochezia. No complications from the medications currently being used. In 6/14 hemoglobin 12.4, in 3-15 hemoglobin 10.8, MCV 87.2, in 5-15 hemoglobin 12.9, MCV 86.9, in 7-15 hemoglobin 11.1, MCV 86.4.Marland Kitchen  DEMENTIA: The dementia remaines stable and continues to function adequately in the current living environment with supervision.  The patient has had little changes in behavior. No complications noted from the medications presently being used.  HYPERLIPIDEMIA: No complications from the medications presently being used. Last fasting lipid panel showed : In 5/14 HDL 36 otherwise FastIn lipid panel normal, in 5-15 fasting lipid panel normal  PAST MEDICAL HISTORY : Reviewed.  No changes.  CURRENT MEDICATIONS: Reviewed per [facility]  REVIEW OF SYSTEMS:  GENERAL: no change in appetite, no fatigue, no weight changes, no fever, chills or weakness RESPIRATORY: no cough, SOB, DOE, wheezing, hemoptysis CARDIAC: no chest pain, edema or palpitations GI: no abdominal pain, diarrhea, constipation, heart burn, nausea or vomiting  PHYSICAL EXAMINATION  VS: See vital signs section  GENERAL: no acute distress, moderately obese body habitus EYES: Normal sclerae, normal conjunctivae, no discharge NECK: supple, trachea midline, no neck masses, no thyroid tenderness, no thyromegaly LYMPHATICS: No cervical lymphadenopathy, no supraclavicular lymphadenopathy RESPIRATORY: breathing is even & unlabored, BS CTAB CARDIAC: RRR, no murmur,no extra heart sounds, no edema GI: abdomen soft, normal BS, no masses, no tenderness, no hepatomegaly, no  splenomegaly PSYCHIATRIC: the patient is alert & oriented to person, affect & behavior appropriate  LABS/RADIOLOGY: 7-15 hemoglobin 11.1, MCV 86.4 otherwise CBC normal, BMP normal 5-15 CBC normal, total protein 5.9, albumin 3.2 otherwise liver profile normal, Depakote level 24 3-15 BMP normal, hemoglobin 10.8, MCV 7.2 otherwise CBC normal 8-14 albumin 3.2 otherwise he will profile normal, Depakote level 13.8  6/14 CBC normal 4/14 BNP normal  ASSESSMENT/PLAN:  Anemia of chronic disease-stable Hyperlipidemia-well controlled. Dementia-stable Constipation-well-controlled GERD-stable. Anxiety-stable Depression-stable  CPT CODE: 92446  Newton Pigg. Kerry Dory, MD [facility] [phone]

## 2014-01-08 ENCOUNTER — Non-Acute Institutional Stay (SKILLED_NURSING_FACILITY): Payer: PRIVATE HEALTH INSURANCE | Admitting: Internal Medicine

## 2014-01-08 DIAGNOSIS — K5900 Constipation, unspecified: Secondary | ICD-10-CM

## 2014-01-08 DIAGNOSIS — F03918 Unspecified dementia, unspecified severity, with other behavioral disturbance: Secondary | ICD-10-CM

## 2014-01-08 DIAGNOSIS — E785 Hyperlipidemia, unspecified: Secondary | ICD-10-CM

## 2014-01-08 DIAGNOSIS — D638 Anemia in other chronic diseases classified elsewhere: Secondary | ICD-10-CM

## 2014-01-08 DIAGNOSIS — F0391 Unspecified dementia with behavioral disturbance: Secondary | ICD-10-CM

## 2014-01-09 DIAGNOSIS — K5900 Constipation, unspecified: Secondary | ICD-10-CM | POA: Insufficient documentation

## 2014-01-09 DIAGNOSIS — D638 Anemia in other chronic diseases classified elsewhere: Secondary | ICD-10-CM | POA: Insufficient documentation

## 2014-01-09 NOTE — Progress Notes (Signed)
        PROGRESS NOTE  DATE: 01-08-14  FACILITY: Nursing Home Location: [facility]  LEVEL OF CARE: SNF (31)  Routine Visit  CHIEF COMPLAINT:  Manage anemia of chronic disease, dementia and hyperlipidemia  HISTORY OF PRESENT ILLNESS:  REASSESSMENT OF ONGOING PROBLEM(S):  ANEMIA: The anemia has been stable. The patient denies fatigue, melena or hematochezia. No complications from the medications currently being used. In 6/14 hemoglobin 12.4, in 3-15 hemoglobin 10.8, MCV 87.2, in 5-15 hemoglobin 12.9, MCV 86.9, in 7-15 hemoglobin 11.1, MCV 86.4, in 10-15 hemoglobin 12.1.  DEMENTIA: The dementia remaines stable and continues to function adequately in the current living environment with supervision.  The patient has had little changes in behavior. No complications noted from the medications presently being used.  HYPERLIPIDEMIA: No complications from the medications presently being used. Last fasting lipid panel showed : In 5/14 HDL 36 otherwise FastIn lipid panel normal, in 5-15 fasting lipid panel normal  PAST MEDICAL HISTORY : Reviewed.  No changes.  CURRENT MEDICATIONS: Reviewed per [facility]  REVIEW OF SYSTEMS:  GENERAL: no change in appetite, no fatigue, no weight changes, no fever, chills or weakness RESPIRATORY: no cough, SOB, DOE, wheezing, hemoptysis CARDIAC: no chest pain, edema or palpitations GI: no abdominal pain, diarrhea, constipation, heart burn, nausea or vomiting  PHYSICAL EXAMINATION  VS: See vital signs section  GENERAL: no acute distress, moderately obese body habitus EYES: Normal sclerae, normal conjunctivae, no discharge NECK: supple, trachea midline, no neck masses, no thyroid tenderness, no thyromegaly LYMPHATICS: No cervical lymphadenopathy, no supraclavicular lymphadenopathy RESPIRATORY: breathing is even & unlabored, BS CTAB CARDIAC: RRR, no murmur,no extra heart sounds, no edema GI: abdomen soft, normal BS, no masses, no tenderness, no  hepatomegaly, no splenomegaly PSYCHIATRIC: the patient is alert & oriented to person, affect & behavior appropriate  LABS/RADIOLOGY: 10-15 CBC and CMP normal 7-15 hemoglobin 11.1, MCV 86.4 otherwise CBC normal, BMP normal 5-15 CBC normal, total protein 5.9, albumin 3.2 otherwise liver profile normal, Depakote level 24 3-15 BMP normal, hemoglobin 10.8, MCV 7.2 otherwise CBC normal 8-14 albumin 3.2 otherwise he will profile normal, Depakote level 13.8  6/14 CBC normal 4/14 BNP normal  ASSESSMENT/PLAN:  Anemia of chronic disease-hemoglobin normalized. Hyperlipidemia-well controlled. Dementia-stable Constipation-well-controlled GERD-stable. Anxiety-stable Depression-stable UTI-on Septra  CPT CODE: 79024  Newton Pigg. Kerry Dory, MD [facility] [phone]

## 2014-02-12 ENCOUNTER — Other Ambulatory Visit: Payer: Self-pay | Admitting: *Deleted

## 2014-02-12 MED ORDER — LORAZEPAM 0.5 MG PO TABS: ORAL_TABLET | ORAL | Status: DC

## 2014-02-12 NOTE — Telephone Encounter (Signed)
[facility] 

## 2014-03-02 ENCOUNTER — Encounter: Payer: Self-pay | Admitting: Internal Medicine

## 2014-03-02 ENCOUNTER — Non-Acute Institutional Stay (SKILLED_NURSING_FACILITY): Payer: PRIVATE HEALTH INSURANCE | Admitting: Internal Medicine

## 2014-03-02 DIAGNOSIS — IMO0002 Reserved for concepts with insufficient information to code with codable children: Secondary | ICD-10-CM | POA: Insufficient documentation

## 2014-03-02 DIAGNOSIS — F329 Major depressive disorder, single episode, unspecified: Secondary | ICD-10-CM

## 2014-03-02 DIAGNOSIS — M1710 Unilateral primary osteoarthritis, unspecified knee: Secondary | ICD-10-CM | POA: Insufficient documentation

## 2014-03-02 DIAGNOSIS — M179 Osteoarthritis of knee, unspecified: Secondary | ICD-10-CM

## 2014-03-02 DIAGNOSIS — M069 Rheumatoid arthritis, unspecified: Secondary | ICD-10-CM | POA: Insufficient documentation

## 2014-03-02 DIAGNOSIS — I10 Essential (primary) hypertension: Secondary | ICD-10-CM

## 2014-03-02 DIAGNOSIS — I639 Cerebral infarction, unspecified: Secondary | ICD-10-CM | POA: Insufficient documentation

## 2014-03-02 DIAGNOSIS — F32A Depression, unspecified: Secondary | ICD-10-CM

## 2014-03-02 DIAGNOSIS — E039 Hypothyroidism, unspecified: Secondary | ICD-10-CM | POA: Insufficient documentation

## 2014-03-02 NOTE — Progress Notes (Addendum)
Patient ID: Mallory Herring, female   DOB: March 07, 1931, 80 y.o.   MRN: [id number]    [facility]  Chief Complaint  Patient presents with  . Medical Management of Chronic Issues   Allergies  Allergen Reactions  . Penicillins    HPI 80 y/o female pt is here for long term care. She has dementia. She has pmh of dementia, gerd, constipation, hypothyroidism, depression among others She denies any concerns today. No new concern from staff. She had a fall last week while trying to go to the bathroom unassisted  Review of Systems  Constitutional: Negative for fever, chills HENT: Negative for congestion  Respiratory: Negative for cough, sputum production, shortness of breath and wheezing.   Cardiovascular: Negative for chest pain, palpitations Gastrointestinal: Negative for heartburn, nausea, vomiting, abdominal pain  Genitourinary: Negative for dysuria Musculoskeletal: Negative for falls.  Skin: Negative for itching and rash.  Neurological: Negative for dizziness, headaches.  Psychiatric/Behavioral: positive for memory loss.   Past Medical History  Diagnosis Date  . Dementia   . CVA (cerebral infarction)   . Hemiparesis     right  . Dysphagia   . Osteoporosis   . Hyperlipemia   . Anemia   . Anxiety   . Depression   . Difficulty in walking(719.7)   . Muscle weakness (generalized)   . Personal history of traumatic brain injury   . Personal history of fall   . Aftercare for healing traumatic fracture of hip      Medication List       This list is accurate as of: 03/02/14  5:22 PM.  Always use your most recent med list.               acetaminophen 500 MG tablet  Commonly known as:  TYLENOL  Take 500 mg by mouth at bedtime.     albuterol 108 (90 BASE) MCG/ACT inhaler  Commonly known as:  PROVENTIL HFA;VENTOLIN HFA  Inhale 2 puffs into the lungs every 4 (four) hours as needed for wheezing.     amLODipine 10 MG tablet    Commonly known as:  NORVASC  Take 10 mg by mouth daily.     budesonide-formoterol 160-4.5 MCG/ACT inhaler  Commonly known as:  SYMBICORT  Inhale 2 puffs into the lungs 2 (two) times daily.     calcium-vitamin D 500-200 MG-UNIT per tablet  Commonly known as:  OSCAL WITH D  Take 1 tablet by mouth daily with breakfast.     CATAFLAM 50 MG tablet  Generic drug:  diclofenac  Take 25 mg by mouth 2 (two) times daily.     cholecalciferol 1000 UNITS tablet  Commonly known as:  VITAMIN D  Take 1,000 Units by mouth daily.     escitalopram 10 MG tablet  Commonly known as:  LEXAPRO  Take 10 mg by mouth daily.     furosemide 20 MG tablet  Commonly known as:  LASIX  Take 20 mg by mouth.     levothyroxine 50 MCG tablet  Commonly known as:  SYNTHROID, LEVOTHROID  Take 50 mcg by mouth daily before breakfast.     loratadine 10 MG tablet  Commonly known as:  CLARITIN  Take 10 mg by mouth daily.     metoprolol tartrate 25 MG tablet  Commonly known as:  LOPRESSOR  Take 12.5 mg by mouth 2 (two) times daily.     montelukast 10 MG tablet  Commonly known as:  SINGULAIR  Take 10 mg by mouth at bedtime.     omeprazole 20 MG capsule  Commonly known as:  PRILOSEC  Take 20 mg by mouth daily.     OxyCODONE 10 mg T12a 12 hr tablet  Commonly known as:  OXYCONTIN  Take 10 mg by mouth every 12 (twelve) hours.     polyethylene glycol packet  Commonly known as:  MIRALAX / GLYCOLAX  Take 17 g by mouth daily.     polyvinyl alcohol 1.4 % ophthalmic solution  Commonly known as:  LIQUIFILM TEARS  Place 1 drop into both eyes 2 (two) times daily.     rivaroxaban 10 MG Tabs tablet  Commonly known as:  XARELTO  Take 10 mg by mouth daily.     rOPINIRole 0.25 MG tablet  Commonly known as:  REQUIP  Take 0.25 mg by mouth at bedtime.     senna 8.6 MG Tabs tablet  Commonly known as:  SENOKOT  Take 1 tablet by mouth 2 (two) times daily.     temazepam 7.5 MG capsule  Commonly known as:  RESTORIL   Take 7.5 mg by mouth at bedtime.       Physical exam BP 121/77 mmHg  Pulse 83  Temp(Src) 97.5 F (36.4 C)  Resp 16  General- elderly female in no acute distress Head- atraumatic, normocephalic Eyes- PERRLA, EOMI, no pallor, no icterus, no discharge Neck- no lymphadenopathy Mouth- normal mucus membrane Cardiovascular- normal s1,s2, no murmurs, no leg edema Respiratory- bilateral clear to auscultation, no wheeze, no rhonchi, no crackles Abdomen- bowel sounds present, soft, non tender Musculoskeletal- able to move all 4 extremities, no leg edema, arthritis changes in her digits Neurological- no focal deficit, on wheelchair Skin- warm and dry Psychiatry- alert and oriented to person. normal mood and affect  Labs 01/05/14 na 136, k 4.2, bun 19, cr 1.3, lft wnl, hb 12.1, hct 41.2, wbc 7.3, plt 199  Assessment/plan  Depression Mood remains stable. Continue lexapro 10 mg daily  OA Continue oxycodone bid and diclofenac, monitor renal function. Continue vitamin d supplement  Hypothyroidism Continue levothyroxine 50 mcg daily, monitor tsh  CVA Stable, on xarelto, bp controlled.   HTN bp stable. Continue lopressor, norvasc and lasix current regimen. Monitor bmp periodically

## 2014-04-02 ENCOUNTER — Non-Acute Institutional Stay (SKILLED_NURSING_FACILITY): Payer: Medicare Other | Admitting: Internal Medicine

## 2014-04-02 DIAGNOSIS — F0150 Vascular dementia without behavioral disturbance: Secondary | ICD-10-CM

## 2014-04-02 DIAGNOSIS — I69359 Hemiplegia and hemiparesis following cerebral infarction affecting unspecified side: Secondary | ICD-10-CM | POA: Insufficient documentation

## 2014-04-02 DIAGNOSIS — E785 Hyperlipidemia, unspecified: Secondary | ICD-10-CM

## 2014-04-02 DIAGNOSIS — I6931 Cognitive deficits following cerebral infarction: Secondary | ICD-10-CM

## 2014-04-02 DIAGNOSIS — F418 Other specified anxiety disorders: Secondary | ICD-10-CM

## 2014-04-02 DIAGNOSIS — I69319 Unspecified symptoms and signs involving cognitive functions following cerebral infarction: Secondary | ICD-10-CM | POA: Insufficient documentation

## 2014-04-02 NOTE — Progress Notes (Signed)
Patient ID: Mallory Herring, female   DOB: [birth year], 81 y.o.   MRN: [id number]    [facility]  Chief complaint- medical management of chronic issues  Allergies- PCN  Code status- DNR  HPI 81 y/o female patient is seen for routine visit. She denies any concerns this visit. She is not happy about her room being changed and would like to return to her previous room.  No new concern from staff.  Review of Systems  Constitutional: Negative for fever, chills HENT: Negative for congestion  Respiratory: Negative for cough, sputum production, shortness of breath and wheezing.   Cardiovascular: Negative for chest pain, palpitations Gastrointestinal: Negative for heartburn, nausea, vomiting, abdominal pain  Genitourinary: Negative for dysuria Musculoskeletal: Negative for falls.   Skin: Negative for itching and rash.  Neurological: Negative for dizziness, headaches.  Psychiatric/Behavioral: positive for memory loss. Has dementia and depression  Past Medical History  Diagnosis Date  . Dementia   . CVA (cerebral infarction)   . Hemiparesis     right  . Dysphagia   . Osteoporosis   . Hyperlipemia   . Anemia   . Anxiety   . Depression   . Difficulty in walking(719.7)   . Muscle weakness (generalized)   . Personal history of traumatic brain injury   . Personal history of fall   . Aftercare for healing traumatic fracture of hip      Medication List       This list is accurate as of: 04/02/14  7:00 PM.  Always use your most recent med list.               aspirin 81 MG tablet  Take 81 mg by mouth daily.     atorvastatin 10 MG tablet  Commonly known as:  LIPITOR  Take 10 mg by mouth daily.     BIOFREEZE 4 % Gel  Generic drug:  Menthol (Topical Analgesic)  Apply 1 application topically 2 (two) times daily.     cholecalciferol 1000 UNITS tablet  Commonly known as:  VITAMIN D  Take 1,000 Units by mouth daily.     divalproex 125 MG DR tablet    Commonly known as:  DEPAKOTE  Take 125 mg by mouth 2 (two) times daily.     donepezil 10 MG tablet  Commonly known as:  ARICEPT  Take 10 mg by mouth at bedtime.     famotidine 20 MG tablet  Commonly known as:  PEPCID  Take 20 mg by mouth 2 (two) times daily.     hydroxypropyl methylcellulose / hypromellose 2.5 % ophthalmic solution  Commonly known as:  ISOPTO TEARS / GONIOVISC  Place 1 drop into both eyes 2 (two) times daily.     LORazepam 0.5 MG tablet  Commonly known as:  ATIVAN  Take 0.25 mg by mouth at bedtime.     senna 8.6 MG Tabs tablet  Commonly known as:  SENOKOT  Take 1 tablet by mouth 2 (two) times daily.     sertraline 100 MG tablet  Commonly known as:  ZOLOFT  Take 100 mg by mouth daily.     vitamin B-12 1000 MCG tablet  Commonly known as:  CYANOCOBALAMIN  Take 1,000 mcg by mouth daily.       Physical exam BP 140/82 mmHg  Pulse 84  Temp(Src) 96.6 F (35.9 C)  Resp 18  SpO2 92%  General- elderly female in no acute distress Head- atraumatic, normocephalic Eyes- PERRLA, EOMI, no  pallor, no icterus, no discharge Neck- no lymphadenopathy Mouth- normal mucus membrane Cardiovascular- normal s1,s2, no murmurs, no leg edema Respiratory- bilateral clear to auscultation, no wheeze, no rhonchi, no crackles Abdomen- bowel sounds present, soft, non tender Musculoskeletal- right sided weakness, no leg edema, arthritis changes in her digits, on wheelchair Neurological- no focal deficit, on wheelchair Skin- warm and dry Psychiatry- alert and oriented to person. normal mood and affect  Labs 01/05/14 na 136, k 4.2, bun 19, cr 1.3, lft wnl, hb 12.1, hct 41.2, wbc 7.3, plt 199 03/10/14 wbc 5.9, hb 11.5, hct 37.9, plt 158, na 138, k 4, bun 23, cr 1, ca 8.6, t.chol 120, ldl 61, tg 90  Assessment/plan  Hyperlipidemia ldl at goal, continue lipitor 10 mg daily for now  Dementia On aricept 10 mg daily, continue this  Depression and anxiety Mood remains stable.  Continue zoloft 100 mg daily and depakote 125 mg bid. Continue ativan for anxiety  CVA with right hemiparesis Stable, on aspirin and statin. bp contorlled. Off all bp meds at present. monitor

## 2014-04-07 LAB — CBC AND DIFFERENTIAL
HCT: 38 % (ref 36–46)
Hemoglobin: 11.6 g/dL — AB (ref 12.0–16.0)
Platelets: 180 10*3/uL (ref 150–399)
WBC: 6.9 10^3/mL

## 2014-04-13 ENCOUNTER — Other Ambulatory Visit: Payer: Self-pay | Admitting: *Deleted

## 2014-04-13 MED ORDER — HYDROCODONE-ACETAMINOPHEN 5-325 MG PO TABS: ORAL_TABLET | ORAL | Status: DC

## 2014-04-13 NOTE — Telephone Encounter (Signed)
[facility] 

## 2014-06-24 ENCOUNTER — Non-Acute Institutional Stay (SKILLED_NURSING_FACILITY): Payer: Medicare Other | Admitting: Internal Medicine

## 2014-06-24 DIAGNOSIS — M19012 Primary osteoarthritis, left shoulder: Secondary | ICD-10-CM | POA: Diagnosis not present

## 2014-06-24 DIAGNOSIS — F0391 Unspecified dementia with behavioral disturbance: Secondary | ICD-10-CM

## 2014-06-24 DIAGNOSIS — K219 Gastro-esophageal reflux disease without esophagitis: Secondary | ICD-10-CM | POA: Diagnosis not present

## 2014-06-24 DIAGNOSIS — K5901 Slow transit constipation: Secondary | ICD-10-CM | POA: Diagnosis not present

## 2014-06-24 DIAGNOSIS — F03918 Unspecified dementia, unspecified severity, with other behavioral disturbance: Secondary | ICD-10-CM

## 2014-06-24 DIAGNOSIS — E785 Hyperlipidemia, unspecified: Secondary | ICD-10-CM

## 2014-06-24 DIAGNOSIS — M19019 Primary osteoarthritis, unspecified shoulder: Secondary | ICD-10-CM | POA: Insufficient documentation

## 2014-06-24 DIAGNOSIS — I69359 Hemiplegia and hemiparesis following cerebral infarction affecting unspecified side: Secondary | ICD-10-CM

## 2014-06-24 DIAGNOSIS — D509 Iron deficiency anemia, unspecified: Secondary | ICD-10-CM | POA: Diagnosis not present

## 2014-06-24 NOTE — Progress Notes (Signed)
Patient ID: Mallory Herring, female   DOB: June 29, 1930, 81 y.o.   MRN: [id number]  [facility]  Chief complaint- medical management of chronic issues  Allergies- PCN  Code status- DNR  HPI 81 y/o female patient is seen for routine visit. She denies any concerns this visit. She would like to be home in [place]. Staff reports agitation and aggressive behavior towards room mate and staff and her room being changed for this. She is followed by psych services. No falls reported. No skin concerns She has PMH of CVA, depression, dementia, HTN, HLD among others  Review of Systems   Constitutional: Negative for fever, chills HENT: Negative for congestion   Respiratory: Negative for cough, sputum production, shortness of breath and wheezing.    Cardiovascular: Negative for chest pain, palpitations Gastrointestinal: Negative for heartburn, nausea, vomiting, abdominal pain   Genitourinary: Negative for dysuria Musculoskeletal: Negative for falls.   Skin: Negative for itching and rash.   Neurological: Negative for dizziness, headaches.   Psychiatric/Behavioral: positive for memory loss. Has dementia and depression    Past medical history reviewed    Medication List       This list is accurate as of: 06/24/14 12:20 PM.  Always use your most recent med list.               aspirin 81 MG tablet  Take 81 mg by mouth daily.     BIOFREEZE 4 % Gel  Generic drug:  Menthol (Topical Analgesic)  Apply 1 application topically 2 (two) times daily.     cholecalciferol 1000 UNITS tablet  Commonly known as:  VITAMIN D  Take 1,000 Units by mouth daily.     divalproex 125 MG DR tablet  Commonly known as:  DEPAKOTE  Take 125 mg by mouth 2 (two) times daily.     donepezil 10 MG tablet  Commonly known as:  ARICEPT  Take 10 mg by mouth at bedtime.     famotidine 20 MG tablet  Commonly known as:  PEPCID  Take 10 mg by mouth at bedtime.     HYDROcodone-acetaminophen 5-325 MG per tablet  Commonly known as:  NORCO/VICODIN  Take one tablet by mouth every 6 hours as needed for moderate pain; Take two tablets by mouth every 6 hours as needed for severe pain     hydroxypropyl methylcellulose / hypromellose 2.5 % ophthalmic solution  Commonly known as:  ISOPTO TEARS / GONIOVISC  Place 1 drop into both eyes 2 (two) times daily.     lidocaine 5 %  Commonly known as:  LIDODERM  Place 1 patch onto the skin daily. Remove & Discard patch within 12 hours or as directed by MD     LORazepam 0.5 MG tablet  Commonly known as:  ATIVAN  Take 0.25 mg by mouth at bedtime.     senna 8.6 MG Tabs tablet  Commonly known as:  SENOKOT  Take 1 tablet by mouth 2 (two) times daily.     sertraline 100 MG tablet  Commonly known as:  ZOLOFT  Take 100 mg by mouth daily.     vitamin B-12 1000 MCG tablet  Commonly known as:  CYANOCOBALAMIN  Take 1,000 mcg by mouth daily.       Physical exam BP 140/78 mmHg  Pulse 76  Temp(Src) 98.1 F (36.7 C)  Resp 18  SpO2 92%  General- elderly female in no acute distress Head- atraumatic, normocephalic Eyes- PERRLA, EOMI, no pallor, no icterus,  no discharge Neck- no lymphadenopathy Mouth- normal mucus membrane Cardiovascular- normal s1,s2, no murmurs, no leg edema Respiratory- bilateral clear to auscultation, no wheeze, no rhonchi, no crackles Abdomen- bowel sounds present, soft, non tender Musculoskeletal- good grip strength in both arms, left arm abduction limited with left shoulder ROM limited, mild kyphosis, arthritis changes in her hand digits, on wheelchair Skin- warm and dry Psychiatry- alert and oriented to person. Flat affect and poor insight  Labs 01/05/14 na 136, k 4.2, bun 19, cr 1.3, lft wnl, hb 12.1, hct 41.2, wbc 7.3, plt 199 03/10/14 wbc 5.9, hb 11.5, hct 37.9, plt 158, na 138, k 4, bun 23, cr 1, ca 8.6, t.chol 120, ldl 61, tg 90 04/07/14 wbc 6.9, hb 11.6, hct 37.8, plt 180, fe 34, %  sat 10, ferritin 28, b12 1275  Assessment/plan  Iron deficiency anemia Low iron stores on review of lab. Start ferrous sulfate 325 mg bid for now. Monitor cbc and iron store in 6 weeks  Hyperlipidemia ldl at goal, off lipitor with request from family  gerd Symptoms controlled and tolerating famotidine 10 mg daily well.   Dementia with behavioral disturbance On aricept 10 mg daily, zoloft 100 mg daily and depakote 125 mg bid. Continue ativan for anxiety. assistance with ADLs, pressure ulcer prophylaxis and fall precautions in place. Has failed GDR of depakote  Old CVA  Stable, on aspirin. Off statin with LDL at goal.   DJD Continue norco 5-325 at bedtime and change her prn dosing to 1-2 tab q8h prn for now. continue lidoderm patch. Continue biofreeze gel.   Constipation Stable with senna, monitor

## 2014-07-15 ENCOUNTER — Encounter: Payer: Self-pay | Admitting: Internal Medicine

## 2014-07-15 ENCOUNTER — Non-Acute Institutional Stay (SKILLED_NURSING_FACILITY): Payer: Medicare Other | Admitting: Internal Medicine

## 2014-07-15 DIAGNOSIS — I69354 Hemiplegia and hemiparesis following cerebral infarction affecting left non-dominant side: Secondary | ICD-10-CM

## 2014-07-15 DIAGNOSIS — K219 Gastro-esophageal reflux disease without esophagitis: Secondary | ICD-10-CM

## 2014-07-15 DIAGNOSIS — F0391 Unspecified dementia with behavioral disturbance: Secondary | ICD-10-CM

## 2014-07-15 DIAGNOSIS — F03918 Unspecified dementia, unspecified severity, with other behavioral disturbance: Secondary | ICD-10-CM

## 2014-07-15 NOTE — Progress Notes (Signed)
This encounter was created in error - please disregard.

## 2014-07-15 NOTE — Progress Notes (Signed)
Patient ID: Mallory Herring, female   DOB: [birth year], 81 y.o.   MRN: [id number]    [facility]  Chief complaint- medical management of chronic issues  Allergies- PCN  Code status- DNR  HPI 81 y/o female patient is seen for routine visit. She denies any concerns this visit. She has been calmer as per staff, no recent emotional outbursts. She has left sided hemiplegia from her CVA. she is followed by psych services. No falls reported. No skin concerns She has PMH of CVA, depression, dementia, HTN, HLD among others  Review of Systems   Constitutional: Negative for fever, chills HENT: Negative for congestion   Respiratory: Negative for cough, sputum production, shortness of breath and wheezing.    Cardiovascular: Negative for chest pain, palpitations Gastrointestinal: Negative for heartburn, nausea, vomiting, abdominal pain   Genitourinary: Negative for dysuria Musculoskeletal: Negative for falls.   Skin: Negative for itching and rash.   Neurological: Negative for dizziness, headaches.   Psychiatric/Behavioral: positive for memory loss. Has dementia and depression    Past medical history reviewed    Medication List       This list is accurate as of: 07/15/14  2:16 PM.  Always use your most recent med list.               aspirin 81 MG tablet  Take 81 mg by mouth daily.     BIOFREEZE 4 % Gel  Generic drug:  Menthol (Topical Analgesic)  Apply 1 application topically 2 (two) times daily.     cholecalciferol 1000 UNITS tablet  Commonly known as:  VITAMIN D  Take 1,000 Units by mouth daily.     divalproex 125 MG DR tablet  Commonly known as:  DEPAKOTE  Take 125 mg by mouth 2 (two) times daily.     donepezil 10 MG tablet  Commonly known as:  ARICEPT  Take 10 mg by mouth daily.     famotidine 20 MG tablet  Commonly known as:  PEPCID  Take 10 mg by mouth daily. For heartburn and reflux     HYDROcodone-acetaminophen 5-325 MG per tablet    Commonly known as:  NORCO/VICODIN  Take one tablet by mouth every 6 hours as needed for moderate pain; Take two tablets by mouth every 6 hours as needed for severe pain     hydroxypropyl methylcellulose / hypromellose 2.5 % ophthalmic solution  Commonly known as:  ISOPTO TEARS / GONIOVISC  Place 1 drop into both eyes 2 (two) times daily.     lidocaine 5 %  Commonly known as:  LIDODERM  Place 1 patch onto the skin daily. Remove & Discard patch within 12 hours or as directed by MD     LORazepam 0.5 MG tablet  Commonly known as:  ATIVAN  Take 0.25 mg by mouth. Every evening     Mineral Oil Heavy Oil  Administer 5 drops into each ear every night at bedtime     polyvinyl alcohol 1.4 % ophthalmic solution  Commonly known as:  LIQUIFILM TEARS  1 drop 2 (two) times daily. Wait 3-5 minutes between 2 eye meds.     senna 8.6 MG Tabs tablet  Commonly known as:  SENOKOT  Take 1 tablet by mouth 2 (two) times daily.     sertraline 100 MG tablet  Commonly known as:  ZOLOFT  Take 100 mg by mouth daily.        Physical exam BP 104/50 mmHg  Pulse 84  Temp(Src) 98.6 F (37 C)  Resp 18  Wt 135 lb 9.6 oz (61.508 kg)  SpO2 95%  Wt Readings from Last 3 Encounters:  07/15/14 135 lb 9.6 oz (61.508 kg)  07/09/13 130 lb 3.2 oz (59.058 kg)  05/28/13 129 lb 12.8 oz (58.877 kg)   General- elderly female in no acute distress Head- atraumatic, normocephalic Eyes- PERRLA, EOMI, no pallor, no icterus, no discharge Neck- no lymphadenopathy Mouth- normal mucus membrane Cardiovascular- normal s1,s2, no murmurs, no leg edema Respiratory- bilateral clear to auscultation, no wheeze, no rhonchi, no crackles Abdomen- bowel sounds present, soft, non tender Musculoskeletal- good grip strength in both arms, left arm abduction limited with left shoulder ROM limited, mild kyphosis, arthritis changes in her hand digits, on wheelchair, left sided weakness> right side Skin- warm and dry Psychiatry-  pleasantly confused with poor insight  Labs 01/05/14 na 136, k 4.2, bun 19, cr 1.3, lft wnl, hb 12.1, hct 41.2, wbc 7.3, plt 199 03/10/14 wbc 5.9, hb 11.5, hct 37.9, plt 158, na 138, k 4, bun 23, cr 1, ca 8.6, t.chol 120, ldl 61, tg 90 04/07/14 wbc 6.9, hb 11.6, hct 37.8, plt 180, fe 34, % sat 10, ferritin 28, b12 1275  Assessment/plan  gerd Stable, continue famotidine 10 mg daily  Dementia with behavioral disturbance On aricept 10 mg daily, zoloft 100 mg daily and depakote 125 mg bid. Continue ativan for anxiety. assistance with ADLs, pressure ulcer prophylaxis and fall precautions in place. Has failed GDR of depakote in past  Left hemiplegia post cva Stable, continue aspirin, off statin per family request.

## 2014-08-13 ENCOUNTER — Non-Acute Institutional Stay (SKILLED_NURSING_FACILITY): Payer: Medicare Other | Admitting: Internal Medicine

## 2014-08-13 DIAGNOSIS — F329 Major depressive disorder, single episode, unspecified: Secondary | ICD-10-CM

## 2014-08-13 DIAGNOSIS — K5900 Constipation, unspecified: Secondary | ICD-10-CM | POA: Diagnosis not present

## 2014-08-13 DIAGNOSIS — K5909 Other constipation: Secondary | ICD-10-CM

## 2014-08-13 DIAGNOSIS — D509 Iron deficiency anemia, unspecified: Secondary | ICD-10-CM

## 2014-08-13 DIAGNOSIS — M19019 Primary osteoarthritis, unspecified shoulder: Secondary | ICD-10-CM | POA: Diagnosis not present

## 2014-08-13 DIAGNOSIS — F0280 Dementia in other diseases classified elsewhere without behavioral disturbance: Secondary | ICD-10-CM

## 2014-08-13 DIAGNOSIS — E559 Vitamin D deficiency, unspecified: Secondary | ICD-10-CM | POA: Diagnosis not present

## 2014-08-13 DIAGNOSIS — F0393 Unspecified dementia, unspecified severity, with mood disturbance: Secondary | ICD-10-CM

## 2014-08-13 NOTE — Progress Notes (Signed)
Patient ID: Mallory Herring, female   DOB: 1930/04/21, 81 y.o.   MRN: [id number]     [facility]  Chief complaint- medical management of chronic issues  Allergies- PCN  Code status- DNR  HPI 81 y/o female patient is seen for routine visit. She denies any concerns this visit. She has left sided hemiplegia from her CVA and is wheelchair bound. she is followed by psych services for her behavior. Does have episode of irritablity ad agitation. Calm this visit. No falls reported. Weight is stable. No skin concerns She has PMH of CVA, depression, dementia, HTN, HLD among others  Review of Systems   Constitutional: Negative for fever, chills HENT: Negative for congestion   Respiratory: Negative for cough, sputum production, shortness of breath and wheezing.    Cardiovascular: Negative for chest pain, palpitations Gastrointestinal: Negative for heartburn, nausea, vomiting, abdominal pain   Genitourinary: Negative for dysuria Musculoskeletal: Negative for falls.   Skin: Negative for itching and rash.   Neurological: Negative for dizziness, headaches.   Psychiatric/Behavioral: positive for memory loss. Has dementia and depression    Past medical history reviewed    Medication List       This list is accurate as of: 08/13/14  4:53 PM.  Always use your most recent med list.               aspirin 81 MG tablet  Take 81 mg by mouth daily.     BIOFREEZE 4 % Gel  Generic drug:  Menthol (Topical Analgesic)  Apply 1 application topically 2 (two) times daily.     cholecalciferol 1000 UNITS tablet  Commonly known as:  VITAMIN D  Take 1,000 Units by mouth daily.     divalproex 125 MG DR tablet  Commonly known as:  DEPAKOTE  Take 125 mg by mouth 2 (two) times daily.     donepezil 10 MG tablet  Commonly known as:  ARICEPT  Take 10 mg by mouth daily.     famotidine 20 MG tablet  Commonly known as:  PEPCID  Take 10 mg by mouth daily. For heartburn and  reflux     HYDROcodone-acetaminophen 5-325 MG per tablet  Commonly known as:  NORCO/VICODIN  Take one tablet by mouth every 6 hours as needed for moderate pain; Take two tablets by mouth every 6 hours as needed for severe pain     hydroxypropyl methylcellulose / hypromellose 2.5 % ophthalmic solution  Commonly known as:  ISOPTO TEARS / GONIOVISC  Place 1 drop into both eyes 2 (two) times daily.     lidocaine 5 %  Commonly known as:  LIDODERM  Place 1 patch onto the skin daily. Remove & Discard patch within 12 hours or as directed by MD     LORazepam 0.5 MG tablet  Commonly known as:  ATIVAN  Take 0.25 mg by mouth. Every evening     Mineral Oil Heavy Oil  Administer 5 drops into each ear every night at bedtime     polyvinyl alcohol 1.4 % ophthalmic solution  Commonly known as:  LIQUIFILM TEARS  1 drop 2 (two) times daily. Wait 3-5 minutes between 2 eye meds.     senna 8.6 MG Tabs tablet  Commonly known as:  SENOKOT  Take 1 tablet by mouth 2 (two) times daily.     sertraline 100 MG tablet  Commonly known as:  ZOLOFT  Take 100 mg by mouth daily.  Physical exam BP 107/62 mmHg  Pulse 77  Temp(Src) 97 F (36.1 C)  Resp 16  Ht 5\' 5"  (1.651 m)  Wt 134 lb (60.782 kg)  BMI 22.30 kg/m2  SpO2 99%  Wt Readings from Last 3 Encounters:  08/13/14 134 lb (60.782 kg)  07/15/14 135 lb 9.6 oz (61.508 kg)  07/09/13 130 lb 3.2 oz (59.058 kg)   General- elderly female in no acute distress Head- atraumatic, normocephalic Eyes- PERRLA, EOMI, no pallor, no icterus, no discharge Neck- no lymphadenopathy Mouth- normal mucus membrane Cardiovascular- normal s1,s2, no murmurs, no leg edema Respiratory- bilateral clear to auscultation, no wheeze, no rhonchi, no crackles Abdomen- bowel sounds present, soft, non tender Musculoskeletal- good grip strength in both arms, left arm abduction limited with left shoulder ROM limited, mild kyphosis, arthritis changes in her hand digits, on  wheelchair, left sided weakness> right side Skin- warm and dry Psychiatry- pleasantly confused with poor insight  Labs 01/05/14 na 136, k 4.2, bun 19, cr 1.3, lft wnl, hb 12.1, hct 41.2, wbc 7.3, plt 199 03/10/14 wbc 5.9, hb 11.5, hct 37.9, plt 158, na 138, k 4, bun 23, cr 1, ca 8.6, t.chol 120, ldl 61, tg 90 04/07/14 wbc 6.9, hb 11.6, hct 37.8, plt 180, fe 34, % sat 10, ferritin 28, b12 1275  Assessment/plan  Constipation Stable, continue senna 2 tab daily. Continue hydration  Vitamin d def on vitamin d 1000 u daily. Add ca-vit d 500-400 bid to this given her high risk for osteoporosis. Check vitamin d level  Iron def anemia Hb 04/07/14 11.6. Continue ferrous sulfate 325 mg bid. Check cbc and iron stores  Depression with dementia Continue zoloft 100 mg daily and depakote 125 mg bid, followed by psych services. Check tsh  OA Stable, continue norco 5-325 1-2 tab q8h prn pain and fall precaution   06/06/14, MD  [facility] Adult [facility] [phone] (Monday-Friday 8 am - 5 pm) [phone] (afterhours)

## 2014-08-14 LAB — HEPATIC FUNCTION PANEL
ALT: 8 U/L (ref 7–35)
AST: 14 U/L (ref 13–35)
Alkaline Phosphatase: 55 U/L (ref 25–125)
Bilirubin, Total: 0.4 mg/dL

## 2014-08-14 LAB — CBC AND DIFFERENTIAL
HCT: 38 % (ref 36–46)
Hemoglobin: 11.9 g/dL — AB (ref 12.0–16.0)
PLATELETS: 175 10*3/uL (ref 150–399)
WBC: 5.2 10^3/mL

## 2014-08-14 LAB — BASIC METABOLIC PANEL
BUN: 22 mg/dL — AB (ref 4–21)
CREATININE: 0.9 mg/dL (ref 0.5–1.1)
GLUCOSE: 72 mg/dL
POTASSIUM: 4.5 mmol/L (ref 3.4–5.3)
Sodium: 140 mmol/L (ref 137–147)

## 2014-08-14 LAB — TSH: TSH: 2.25 u[IU]/mL (ref 0.41–5.90)

## 2014-09-09 ENCOUNTER — Non-Acute Institutional Stay (SKILLED_NURSING_FACILITY): Payer: Medicare Other | Admitting: Internal Medicine

## 2014-09-09 ENCOUNTER — Encounter: Payer: Self-pay | Admitting: Internal Medicine

## 2014-09-09 DIAGNOSIS — K219 Gastro-esophageal reflux disease without esophagitis: Secondary | ICD-10-CM | POA: Diagnosis not present

## 2014-09-09 DIAGNOSIS — K5909 Other constipation: Secondary | ICD-10-CM

## 2014-09-09 DIAGNOSIS — D509 Iron deficiency anemia, unspecified: Secondary | ICD-10-CM

## 2014-09-09 DIAGNOSIS — K5900 Constipation, unspecified: Secondary | ICD-10-CM | POA: Diagnosis not present

## 2014-09-09 NOTE — Progress Notes (Signed)
Patient ID: Mallory Herring, female   DOB: 16-Jul-1930, 81 y.o.   MRN: [id number]    [facility]  Code Status: DNR  Chief Complaint  Patient presents with  . Medical Management of Chronic Issues    Routine Visit     Allergies  Allergen Reactions  . Penicillins     HPI 81 y/o female patient is seen for routine visit. She denies any concerns this visit. No new concerns from staff. No falls reported. Weight is stable. No skin concernsShe has left sided hemiplegia from her CVA and is wheelchair bound. she is followed by psych services for her behavior. Does have episode of irritablity ad agitation. She has PMH of CVA, depression, dementia, HTN, HLD among others.   Review of Systems   Constitutional: Negative for fever, chills HENT: Negative for congestion   Respiratory: Negative for cough, shortness of breath and wheezing.    Cardiovascular: Negative for chest pain, palpitations Gastrointestinal: Negative for heartburn, nausea, vomiting, abdominal pain   Genitourinary: Negative for dysuria Neurological: Negative for dizziness, headaches.   Psychiatric/Behavioral: positive for memory loss. Has dementia and depression    Past medical history reviewed    Medication List       This list is accurate as of: 09/09/14  4:58 PM.  Always use your most recent med list.               aspirin 81 MG tablet  Take 81 mg by mouth daily.     BIOFREEZE 4 % Gel  Generic drug:  Menthol (Topical Analgesic)  Apply 1 application topically 2 (two) times daily.     calcium-vitamin D 500-400 MG-UNIT per tablet  Commonly known as:  OSCAL-500  Take 1 tablet by mouth 2 (two) times daily.     cholecalciferol 1000 UNITS tablet  Commonly known as:  VITAMIN D  Take 1,000 Units by mouth daily.     divalproex 125 MG DR tablet  Commonly known as:  DEPAKOTE  Take 125 mg by mouth 2 (two) times daily.     donepezil 10 MG tablet  Commonly known as:  ARICEPT  Take 10 mg by mouth daily.     famotidine 10 MG tablet  Commonly known as:  PEPCID  Take 10 mg by mouth daily as needed for heartburn or indigestion.     ferrous sulfate 325 (65 FE) MG tablet  Take 325 mg by mouth 2 (two) times daily with a meal.     HYDROcodone-acetaminophen 5-325 MG per tablet  Commonly known as:  NORCO/VICODIN  Take one tablet by mouth every 6 hours as needed for moderate pain; Take two tablets by mouth every 6 hours as needed for severe pain     lidocaine 5 %  Commonly known as:  LIDODERM  Place 1 patch onto the skin daily. Remove & Discard patch within 12 hours or as directed by MD     LORazepam 0.5 MG tablet  Commonly known as:  ATIVAN  1 by mouth every evening scheduled, 1 by mouth twice daily as needed for agitation     Mineral Oil Heavy Oil  Administer 5 drops into each ear every night at bedtime     polyvinyl alcohol 1.4 % ophthalmic solution  Commonly known as:  LIQUIFILM TEARS  1 drop 2 (two) times daily. Wait 3-5 minutes between 2 eye meds.     senna 8.6 MG Tabs tablet  Commonly known as:  SENOKOT  Take 1 tablet by  mouth 2 (two) times daily.     sertraline 100 MG tablet  Commonly known as:  ZOLOFT  Take 100 mg by mouth daily.        Physical exam BP 159/93 mmHg  Pulse 80  Temp(Src) 98.6 F (37 C) (Oral)  Resp 18  Ht 6\' 1"  (1.854 m)  Wt 133 lb 6.4 oz (60.51 kg)  BMI 17.60 kg/m2  SpO2 96%  Wt Readings from Last 3 Encounters:  09/09/14 133 lb 6.4 oz (60.51 kg)  08/13/14 134 lb (60.782 kg)  07/15/14 135 lb 9.6 oz (61.508 kg)   General- elderly female in no acute distress Head- atraumatic, normocephalic Eyes- PERRLA, EOMI, no pallor, no icterus, no discharge Neck- no lymphadenopathy Mouth- normal mucus membrane Cardiovascular- normal s1,s2, no murmurs, no leg edema Respiratory- bilateral clear to auscultation, no wheeze, no rhonchi, no crackles Abdomen- bowel sounds present, soft, non tender Musculoskeletal- good grip strength in both arms, left arm abduction  limited with left shoulder ROM limited, mild kyphosis, arthritis changes in her hand digits, on wheelchair, left sided weakness> right side Skin- warm and dry Psychiatry- pleasantly confused with poor insight  Labs 01/05/14 na 136, k 4.2, bun 19, cr 1.3, lft wnl, hb 12.1, hct 41.2, wbc 7.3, plt 199 03/10/14 wbc 5.9, hb 11.5, hct 37.9, plt 158, na 138, k 4, bun 23, cr 1, ca 8.6, t.chol 120, ldl 61, tg 90 04/07/14 wbc 6.9, hb 11.6, hct 37.8, plt 180, fe 34, % sat 10, ferritin 28, b12 1275 08/16/14 vit d 30, wbc 5.2, hb 11.9, plt 175, na 140, k 4.5, bun 22, cr 0.93, alb 3.2, tsh 2.25  Assessment/plan  Iron def anemia Hb stable around 11. Continue ferrous sulfate 325 mg bid.   gerd Stable, continue pepcid 10 mg daily prn  Constipation Stable, continue senokot current regimen and hydration encouraged  08/18/14, MD  [facility] Adult Medicine [phone] (Monday-Friday 8 am - 5 pm) [phone] (afterhours)

## 2014-10-05 ENCOUNTER — Non-Acute Institutional Stay (SKILLED_NURSING_FACILITY): Payer: Medicare Other | Admitting: Internal Medicine

## 2014-10-05 DIAGNOSIS — M818 Other osteoporosis without current pathological fracture: Secondary | ICD-10-CM | POA: Diagnosis not present

## 2014-10-05 DIAGNOSIS — IMO0002 Reserved for concepts with insufficient information to code with codable children: Secondary | ICD-10-CM

## 2014-10-05 DIAGNOSIS — E507 Other ocular manifestations of vitamin A deficiency: Secondary | ICD-10-CM | POA: Diagnosis not present

## 2014-10-05 DIAGNOSIS — M179 Osteoarthritis of knee, unspecified: Secondary | ICD-10-CM | POA: Diagnosis not present

## 2014-10-05 DIAGNOSIS — I69359 Hemiplegia and hemiparesis following cerebral infarction affecting unspecified side: Secondary | ICD-10-CM

## 2014-10-05 DIAGNOSIS — M150 Primary generalized (osteo)arthritis: Secondary | ICD-10-CM | POA: Insufficient documentation

## 2014-10-05 DIAGNOSIS — M1710 Unilateral primary osteoarthritis, unspecified knee: Secondary | ICD-10-CM

## 2014-10-05 NOTE — Progress Notes (Signed)
Patient ID: Mallory Herring, female   DOB: 11-25-1930, 81 y.o.   MRN: [id number]       [facility]  Code Status: DNR  Chief Complaint  Patient presents with  . Medical Management of Chronic Issues    Allergies  Allergen Reactions  . Penicillins     HPI 81 y/o female patient is seen for routine visit. She would like to go home. She is pleasantly confused. She denies any concerns this visit. No new concerns from staff. No falls reported. Weight is stable. No skin concerns. She has left sided hemiplegia from her CVA and is wheelchair bound. she is followed by psych services for her behavior. She has PMH of CVA, depression, dementia, HTN, HLD among others.   Review of Systems   Constitutional: Negative for fever, chills HENT: Negative for congestion   Respiratory: Negative for cough, shortness of breath and wheezing.    Cardiovascular: Negative for chest pain, palpitations Gastrointestinal: Negative for heartburn, nausea, vomiting, abdominal pain   Genitourinary: Negative for dysuria Neurological: Negative for dizziness, headaches.   Psychiatric/Behavioral: positive for memory loss. Has dementia and depression    Past medical history reviewed    Medication List       This list is accurate as of: 10/05/14  2:10 PM.  Always use your most recent med list.               aspirin 81 MG tablet  Take 81 mg by mouth daily.     BIOFREEZE 4 % Gel  Generic drug:  Menthol (Topical Analgesic)  Apply 1 application topically 2 (two) times daily.     calcium-vitamin D 500-400 MG-UNIT per tablet  Commonly known as:  OSCAL-500  Take 1 tablet by mouth 2 (two) times daily.     cholecalciferol 1000 UNITS tablet  Commonly known as:  VITAMIN D  Take 1,000 Units by mouth daily.     divalproex 125 MG DR tablet  Commonly known as:  DEPAKOTE  Take 125 mg by mouth 2 (two) times daily.     donepezil 10 MG tablet  Commonly known as:  ARICEPT  Take 10 mg by mouth daily.     famotidine 10 MG tablet  Commonly known as:  PEPCID  Take 10 mg by mouth daily as needed for heartburn or indigestion.     ferrous sulfate 325 (65 FE) MG tablet  Take 325 mg by mouth 2 (two) times daily with a meal.     HYDROcodone-acetaminophen 5-325 MG per tablet  Commonly known as:  NORCO/VICODIN  Take one tablet by mouth every 6 hours as needed for moderate pain; Take two tablets by mouth every 6 hours as needed for severe pain     lidocaine 5 %  Commonly known as:  LIDODERM  Place 1 patch onto the skin daily. Remove & Discard patch within 12 hours or as directed by MD     LORazepam 0.5 MG tablet  Commonly known as:  ATIVAN  1 by mouth every evening scheduled, 1 by mouth twice daily as needed for agitation     Mineral Oil Heavy Oil  Administer 5 drops into each ear every night at bedtime     polyvinyl alcohol 1.4 % ophthalmic solution  Commonly known as:  LIQUIFILM TEARS  1 drop 2 (two) times daily. Wait 3-5 minutes between 2 eye meds.     senna 8.6 MG Tabs tablet  Commonly known as:  SENOKOT  Take 1  tablet by mouth 2 (two) times daily.     sertraline 100 MG tablet  Commonly known as:  ZOLOFT  Take 100 mg by mouth daily.        Physical exam BP 138/82 mmHg  Pulse 72  Temp(Src) 97.3 F (36.3 C)  Resp 18  Ht 5\' 5"  (1.651 m)  Wt 131 lb 6.4 oz (59.603 kg)  BMI 21.87 kg/m2  SpO2 96%  Wt Readings from Last 3 Encounters:  10/05/14 131 lb 6.4 oz (59.603 kg)  09/09/14 133 lb 6.4 oz (60.51 kg)  08/13/14 134 lb (60.782 kg)   General- elderly female in no acute distress Head- atraumatic, normocephalic Eyes- PERRLA, EOMI, no pallor, no icterus, no discharge Neck- no lymphadenopathy Mouth- normal mucus membrane Cardiovascular- normal s1,s2, no murmurs, no leg edema Respiratory- bilateral clear to auscultation, no wheeze, no rhonchi, no crackles Abdomen- bowel sounds present, soft, non tender Musculoskeletal- good grip strength in both arms, left arm abduction  limited with left shoulder ROM limited, mild kyphosis, arthritis changes in her hand digits, on wheelchair, left sided weakness> right side Skin- warm and dry Psychiatry- pleasantly confused with poor insight  Labs 01/05/14 na 136, k 4.2, bun 19, cr 1.3, lft wnl, hb 12.1, hct 41.2, wbc 7.3, plt 199 03/10/14 wbc 5.9, hb 11.5, hct 37.9, plt 158, na 138, k 4, bun 23, cr 1, ca 8.6, t.chol 120, ldl 61, tg 90 04/07/14 wbc 6.9, hb 11.6, hct 37.8, plt 180, fe 34, % sat 10, ferritin 28, b12 1275 08/16/14 vit d 30, wbc 5.2, hb 11.9, plt 175, na 140, k 4.5, bun 22, cr 0.93, alb 3.2, tsh 2.25  Assessment/plan  Old CVA With hemiparesis. Wheelchair bound, continue aspirin 81 mg daily  Disuse osteoporosis Continue vitamin d 1000 u daily with oscal  Xerophthalmia Continue artifical tear drops  OA Stable, continue lidoderm patch with prn norco for pain.   08/18/14, MD  [facility] [phone] (Monday-Friday 8 am - 5 pm) [phone] (afterhours)

## 2014-10-07 ENCOUNTER — Other Ambulatory Visit: Payer: Self-pay | Admitting: *Deleted

## 2014-10-07 MED ORDER — LORAZEPAM 0.5 MG PO TABS: ORAL_TABLET | ORAL | Status: DC

## 2014-10-07 NOTE — Telephone Encounter (Signed)
[facility] 

## 2014-11-20 ENCOUNTER — Non-Acute Institutional Stay (SKILLED_NURSING_FACILITY): Payer: Medicare Other | Admitting: Internal Medicine

## 2014-11-20 DIAGNOSIS — E46 Unspecified protein-calorie malnutrition: Secondary | ICD-10-CM | POA: Diagnosis not present

## 2014-11-20 DIAGNOSIS — F0151 Vascular dementia with behavioral disturbance: Secondary | ICD-10-CM

## 2014-11-20 DIAGNOSIS — I509 Heart failure, unspecified: Secondary | ICD-10-CM

## 2014-11-20 DIAGNOSIS — F01518 Vascular dementia, unspecified severity, with other behavioral disturbance: Secondary | ICD-10-CM

## 2014-12-06 DIAGNOSIS — F01518 Vascular dementia, unspecified severity, with other behavioral disturbance: Secondary | ICD-10-CM | POA: Insufficient documentation

## 2014-12-06 DIAGNOSIS — I509 Heart failure, unspecified: Secondary | ICD-10-CM | POA: Insufficient documentation

## 2014-12-06 DIAGNOSIS — F0151 Vascular dementia with behavioral disturbance: Secondary | ICD-10-CM | POA: Insufficient documentation

## 2014-12-06 DIAGNOSIS — E46 Unspecified protein-calorie malnutrition: Secondary | ICD-10-CM | POA: Insufficient documentation

## 2014-12-06 NOTE — Progress Notes (Signed)
Patient ID: Mallory Herring, female   DOB: [birth year], 81 y.o.   MRN: [id number]       [facility]  Code Status: DNR  Chief Complaint  Patient presents with  . Medical Management of Chronic Issues    Allergies  Allergen Reactions  . Penicillins     HPI 81 y/o female patient is seen for routine visit. Has been losing weight and pending dietary consult for this. Has dementia and behavioral disturbance but medication has been helpful. Denies any concerns.  She is pleasantly confused. No falls reported. Weight is stable. She has left sided hemiplegia from her CVA and is wheelchair bound. she is followed by psych services for her behavior.   Review of Systems   Constitutional: Negative for fever, chills HENT: Negative for congestion   Respiratory: Negative for cough, shortness of breath and wheezing.    Cardiovascular: Negative for chest pain, palpitations Gastrointestinal: Negative for heartburn, nausea, vomiting, abdominal pain   Genitourinary: Negative for dysuria Neurological: Negative for dizziness, headaches.   Psychiatric/Behavioral: positive for memory loss. Has dementia and depression    Past medical history reviewed    Medication List       This list is accurate as of: 11/20/14 11:59 PM.  Always use your most recent med list.               aspirin 81 MG tablet  Take 81 mg by mouth daily.     BIOFREEZE 4 % Gel  Generic drug:  Menthol (Topical Analgesic)  Apply 1 application topically 2 (two) times daily.     calcium-vitamin D 500-400 MG-UNIT per tablet  Commonly known as:  OSCAL-500  Take 1 tablet by mouth 2 (two) times daily.     cholecalciferol 1000 UNITS tablet  Commonly known as:  VITAMIN D  Take 1,000 Units by mouth daily.     divalproex 125 MG DR tablet  Commonly known as:  DEPAKOTE  Take 125 mg by mouth 2 (two) times daily.     donepezil 10 MG tablet  Commonly known as:  ARICEPT  Take 10 mg by mouth daily.     famotidine 10 MG  tablet  Commonly known as:  PEPCID  Take 10 mg by mouth daily as needed for heartburn or indigestion.     ferrous sulfate 325 (65 FE) MG tablet  Take 325 mg by mouth 2 (two) times daily with a meal.     HYDROcodone-acetaminophen 5-325 MG per tablet  Commonly known as:  NORCO/VICODIN  Take one tablet by mouth every 6 hours as needed for moderate pain; Take two tablets by mouth every 6 hours as needed for severe pain     lidocaine 5 %  Commonly known as:  LIDODERM  Place 1 patch onto the skin daily. Remove & Discard patch within 12 hours or as directed by MD     LORazepam 0.5 MG tablet  Commonly known as:  ATIVAN  Take 1/2 tablet by mouth every evening for anxiety; Take 1/2 tablet by mouth twice daily as needed for agitation/anxiety     Mineral Oil Heavy Oil  Administer 5 drops into each ear every night at bedtime     polyvinyl alcohol 1.4 % ophthalmic solution  Commonly known as:  LIQUIFILM TEARS  1 drop 2 (two) times daily. Wait 3-5 minutes between 2 eye meds.     senna 8.6 MG Tabs tablet  Commonly known as:  SENOKOT  Take 1 tablet by mouth  2 (two) times daily.     sertraline 100 MG tablet  Commonly known as:  ZOLOFT  Take 100 mg by mouth daily.        Physical exam BP 135/74 mmHg  Pulse 67  Temp(Src) 97 F (36.1 C)  Resp 18  SpO2 93%  Wt Readings from Last 3 Encounters:  10/05/14 131 lb 6.4 oz (59.603 kg)  09/09/14 133 lb 6.4 oz (60.51 kg)  08/13/14 134 lb (60.782 kg)   General- elderly female in no acute distress Head- atraumatic, normocephalic Eyes- PERRLA, EOMI, no pallor, no icterus, no discharge Neck- no lymphadenopathy Mouth- normal mucus membrane Cardiovascular- normal s1,s2, no murmurs, no leg edema Respiratory- bilateral clear to auscultation, no wheeze, no rhonchi, no crackles Abdomen- bowel sounds present, soft, non tender Musculoskeletal- good grip strength in both arms, left arm abduction limited with left shoulder ROM limited, mild kyphosis,  arthritis changes in her hand digits, on wheelchair, left sided weakness> right side Skin- warm and dry Psychiatry- pleasantly confused with poor insight  Labs 01/05/14 na 136, k 4.2, bun 19, cr 1.3, lft wnl, hb 12.1, hct 41.2, wbc 7.3, plt 199 03/10/14 wbc 5.9, hb 11.5, hct 37.9, plt 158, na 138, k 4, bun 23, cr 1, ca 8.6, t.chol 120, ldl 61, tg 90 04/07/14 wbc 6.9, hb 11.6, hct 37.8, plt 180, fe 34, % sat 10, ferritin 28, b12 1275 08/16/14 vit d 30, wbc 5.2, hb 11.9, plt 175, na 140, k 4.5, bun 22, cr 0.93, alb 3.2, tsh 2.25  Assessment/plan  Protein calorie malnutrition With weight loss. Decline anticipated with her progressive dementia. Muscle wasting on exam. Dietary follow up. Continue medpass and fortified food. Monitor weight  Vascular dementia Continue aricept. If continues to lose weight, will d/c aricept. Continue depakote, zoloft and prn ativan for behavioral issues. Followed by psych services  Chronic heart failure euvolemic on exam. Off all medication. Monitor clinically. Breathing stable   Oneal Grout, MD  [facility] Adult Medicine [phone] (Monday-Friday 8 am - 5 pm) [phone] (afterhours)

## 2014-12-18 ENCOUNTER — Non-Acute Institutional Stay (SKILLED_NURSING_FACILITY): Payer: Medicare Other | Admitting: Internal Medicine

## 2014-12-18 DIAGNOSIS — M1710 Unilateral primary osteoarthritis, unspecified knee: Secondary | ICD-10-CM

## 2014-12-18 DIAGNOSIS — D509 Iron deficiency anemia, unspecified: Secondary | ICD-10-CM

## 2014-12-18 DIAGNOSIS — IMO0002 Reserved for concepts with insufficient information to code with codable children: Secondary | ICD-10-CM

## 2014-12-18 DIAGNOSIS — F01518 Vascular dementia, unspecified severity, with other behavioral disturbance: Secondary | ICD-10-CM

## 2014-12-18 DIAGNOSIS — M179 Osteoarthritis of knee, unspecified: Secondary | ICD-10-CM | POA: Diagnosis not present

## 2014-12-18 DIAGNOSIS — F0151 Vascular dementia with behavioral disturbance: Secondary | ICD-10-CM

## 2014-12-18 NOTE — Progress Notes (Signed)
Patient ID: Mallory Herring, female   DOB: 1931/03/15, 81 y.o.   MRN: [id number]      [facility]  Code Status: DNR  Chief Complaint  Patient presents with  . Medical Management of Chronic Issues    Allergies  Allergen Reactions  . Penicillins     HPI 81 y/o female patient is seen for routine visit. She has left sided hemiplegia from her CVA and is wheelchair bound. she has been at her baseline with confusion and dementia. No falls reported. No new skin concern  Review of Systems   Constitutional: Negative for fever, chills HENT: Negative for congestion   Respiratory: Negative for cough, shortness of breath and wheezing.    Cardiovascular: Negative for chest pain, palpitations Gastrointestinal: Negative for heartburn, nausea, vomiting, abdominal pain   Genitourinary: Negative for dysuria Neurological: Negative for dizziness, headaches.   Psychiatric/Behavioral: positive for memory loss. Has dementia and depression    Past medical history reviewed    Medication List       This list is accurate as of: 12/18/14 11:59 PM.  Always use your most recent med list.               aspirin 81 MG tablet  Take 81 mg by mouth daily.     BIOFREEZE 4 % Gel  Generic drug:  Menthol (Topical Analgesic)  Apply 1 application topically 2 (two) times daily.     calcium-vitamin D 500-400 MG-UNIT per tablet  Commonly known as:  OSCAL-500  Take 1 tablet by mouth 2 (two) times daily.     cholecalciferol 1000 UNITS tablet  Commonly known as:  VITAMIN D  Take 1,000 Units by mouth daily.     divalproex 125 MG DR tablet  Commonly known as:  DEPAKOTE  Take 125 mg by mouth 2 (two) times daily.     donepezil 10 MG tablet  Commonly known as:  ARICEPT  Take 10 mg by mouth daily.     famotidine 10 MG tablet  Commonly known as:  PEPCID  Take 10 mg by mouth daily as needed for heartburn or indigestion.     ferrous sulfate 325 (65 FE) MG tablet  Take 325 mg by mouth 2 (two)  times daily with a meal.     HYDROcodone-acetaminophen 5-325 MG per tablet  Commonly known as:  NORCO/VICODIN  Take one tablet by mouth every 6 hours as needed for moderate pain; Take two tablets by mouth every 6 hours as needed for severe pain     lidocaine 5 %  Commonly known as:  LIDODERM  Place 1 patch onto the skin daily. Remove & Discard patch within 12 hours or as directed by MD     LORazepam 0.5 MG tablet  Commonly known as:  ATIVAN  Take 1/2 tablet by mouth every evening for anxiety; Take 1/2 tablet by mouth twice daily as needed for agitation/anxiety     Mineral Oil Heavy Oil  Administer 5 drops into each ear every night at bedtime     polyvinyl alcohol 1.4 % ophthalmic solution  Commonly known as:  LIQUIFILM TEARS  1 drop 2 (two) times daily. Wait 3-5 minutes between 2 eye meds.     senna 8.6 MG Tabs tablet  Commonly known as:  SENOKOT  Take 1 tablet by mouth 2 (two) times daily.     sertraline 100 MG tablet  Commonly known as:  ZOLOFT  Take 100 mg by mouth daily.  Physical exam BP 102/76 mmHg  Pulse 80  Temp(Src) 97 F (36.1 C)  Resp 18  SpO2 97%  Wt Readings from Last 3 Encounters:  10/05/14 131 lb 6.4 oz (59.603 kg)  09/09/14 133 lb 6.4 oz (60.51 kg)  08/13/14 134 lb (60.782 kg)   General- elderly female in no acute distress Head- atraumatic, normocephalic Eyes- PERRLA, EOMI, no pallor, no icterus, no discharge Neck- no lymphadenopathy Mouth- normal mucus membrane Cardiovascular- normal s1,s2, no murmurs, no leg edema Respiratory- bilateral clear to auscultation, no wheeze, no rhonchi, no crackles Abdomen- bowel sounds present, soft, non tender Musculoskeletal- good grip strength in both arms, left arm abduction limited with left shoulder ROM limited, mild kyphosis, arthritis changes in her hand digits, on wheelchair, left sided weakness> right side Skin- warm and dry Psychiatry- pleasantly confused with poor insight  Labs 01/05/14 na 136,  k 4.2, bun 19, cr 1.3, lft wnl, hb 12.1, hct 41.2, wbc 7.3, plt 199 03/10/14 wbc 5.9, hb 11.5, hct 37.9, plt 158, na 138, k 4, bun 23, cr 1, ca 8.6, t.chol 120, ldl 61, tg 90 04/07/14 wbc 6.9, hb 11.6, hct 37.8, plt 180, fe 34, % sat 10, ferritin 28, b12 1275 08/16/14 vit d 30, wbc 5.2, hb 11.9, plt 175, na 140, k 4.5, bun 22, cr 0.93, alb 3.2, tsh 2.25  Assessment/plan  Vascular dementia with behavioral disturbance Continue aricept with depakote, zoloft and prn ativan for behavioral issues. Followed by psych services  Iron def anemia Continue ferrous sulfate 325 mg bid and monitor h&h periodically. Continue senna to prevent constipation  OA Stable, continue biofreeze bid, norco 5-325 1-2 tab q8h prn pain and fall precaution. Continue oscal with vitamin d   Oneal Grout, MD  [facility] [phone] (Monday-Friday 8 am - 5 pm) [phone] (afterhours)      Oneal Grout, MD  [facility] Adult Medicine [phone] (Monday-Friday 8 am - 5 pm) [phone] (afterhours)

## 2015-01-18 ENCOUNTER — Non-Acute Institutional Stay (SKILLED_NURSING_FACILITY): Payer: Medicare Other | Admitting: Internal Medicine

## 2015-01-18 ENCOUNTER — Encounter: Payer: Self-pay | Admitting: Internal Medicine

## 2015-01-18 DIAGNOSIS — K219 Gastro-esophageal reflux disease without esophagitis: Secondary | ICD-10-CM | POA: Diagnosis not present

## 2015-01-18 DIAGNOSIS — K5901 Slow transit constipation: Secondary | ICD-10-CM

## 2015-01-18 DIAGNOSIS — I69359 Hemiplegia and hemiparesis following cerebral infarction affecting unspecified side: Secondary | ICD-10-CM | POA: Diagnosis not present

## 2015-01-18 DIAGNOSIS — D509 Iron deficiency anemia, unspecified: Secondary | ICD-10-CM | POA: Diagnosis not present

## 2015-01-18 NOTE — Progress Notes (Signed)
Patient ID: Mallory Herring, female   DOB: Dec 20, 1930, 81 y.o.   MRN: [id number]      [facility]  Code Status: DNR  Chief Complaint  Patient presents with  . Medical Management of Chronic Issues    Routine Visit     Allergies  Allergen Reactions  . Penicillins     HPI 81 y/o female patient is seen for routine visit. She has been at her baseline. She has PMH of left sided hemiplegia from her CVA and is wheelchair bound. No falls reported. No new skin concern  Review of Systems   Constitutional: Negative for fever, chills HENT: Negative for congestion   Respiratory: Negative for cough, shortness of breath and wheezing.    Cardiovascular: Negative for chest pain, palpitations Gastrointestinal: Negative for heartburn, nausea, vomiting, abdominal pain   Genitourinary: Negative for dysuria Neurological: Negative for dizziness, headaches.   Psychiatric/Behavioral: positive for memory loss. Has dementia and depression    Past medical history reviewed    Medication List       This list is accurate as of: 01/18/15  1:59 PM.  Always use your most recent med list.               aspirin 81 MG tablet  Take 81 mg by mouth daily.     BIOFREEZE 4 % Gel  Generic drug:  Menthol (Topical Analgesic)  Apply 1 application topically 2 (two) times daily.     calcium-vitamin D 500-400 MG-UNIT tablet  Commonly known as:  OSCAL-500  Take 1 tablet by mouth 2 (two) times daily.     cholecalciferol 1000 UNITS tablet  Commonly known as:  VITAMIN D  Take 1,000 Units by mouth daily.     divalproex 125 MG DR tablet  Commonly known as:  DEPAKOTE  Take 125 mg by mouth 2 (two) times daily.     donepezil 10 MG tablet  Commonly known as:  ARICEPT  Take 10 mg by mouth daily.     famotidine 10 MG tablet  Commonly known as:  PEPCID  Take 10 mg by mouth daily as needed for heartburn or indigestion.     ferrous sulfate 325 (65 FE) MG tablet  Take 325 mg by mouth 2 (two) times  daily with a meal.     HYDROcodone-acetaminophen 5-325 MG tablet  Commonly known as:  NORCO/VICODIN  Take one tablet by mouth every 6 hours as needed for moderate pain; Take two tablets by mouth every 6 hours as needed for severe pain     lidocaine 5 %  Commonly known as:  LIDODERM  Place 1 patch onto the skin daily. Remove & Discard patch within 12 hours or as directed by MD     LORazepam 0.5 MG tablet  Commonly known as:  ATIVAN  Take 1/2 tablet by mouth every evening for anxiety; Take 1/2 tablet by mouth twice daily as needed for agitation/anxiety     Mineral Oil Heavy Oil  Administer 5 drops into each ear every night at bedtime     polyvinyl alcohol 1.4 % ophthalmic solution  Commonly known as:  LIQUIFILM TEARS  1 drop 2 (two) times daily. Wait 3-5 minutes between 2 eye meds.     senna 8.6 MG Tabs tablet  Commonly known as:  SENOKOT  Take 1 tablet by mouth 2 (two) times daily.     sertraline 100 MG tablet  Commonly known as:  ZOLOFT  Take 100 mg by mouth daily.  UNABLE TO FIND  Med Name: Med pass 90 mL by mouth three times daily for nutritional support        Physical exam BP 106/70 mmHg  Pulse 84  Temp(Src) 98 F (36.7 C) (Oral)  Resp 16  SpO2 88%  Wt Readings from Last 3 Encounters:  10/05/14 131 lb 6.4 oz (59.603 kg)  09/09/14 133 lb 6.4 oz (60.51 kg)  08/13/14 134 lb (60.782 kg)   General- elderly female in no acute distress Head- atraumatic, normocephalic Eyes- PERRLA, EOMI, no pallor, no icterus, no discharge Neck- no lymphadenopathy Mouth- normal mucus membrane Cardiovascular- normal s1,s2, no murmurs, no leg edema Respiratory- bilateral clear to auscultation, no wheeze, no rhonchi, no crackles Abdomen- bowel sounds present, soft, non tender Musculoskeletal- good grip strength in both arms, left arm abduction limited with left shoulder ROM limited, mild kyphosis, arthritis changes in her hand digits, on wheelchair, left sided weakness> right  side Skin- warm and dry Psychiatry- pleasantly confused with poor insight  Labs CBC Latest Ref Rng 08/14/2014 04/07/2014 09/01/2010  WBC - 5.2 6.9 6.8  Hemoglobin 12.0 - 16.0 g/dL 11.9(A) 11.6(A) 12.6  Hematocrit 36 - 46 % 38 38 39.7  Platelets 150 - 399 K/L 175 180 206   CMP Latest Ref Rng 08/14/2014 09/01/2010  Glucose 70 - 99 mg/dL - 98  BUN 4 - 21 mg/dL 92(E) 16  Creatinine 0.5 - 1.1 mg/dL 0.9 1.00  Sodium 712 - 147 mmol/L 140 137  Potassium 3.4 - 5.3 mmol/L 4.5 4.2  Chloride 96 - 112 mEq/L - 101  CO2 19 - 32 mEq/L - 26  Calcium 8.4 - 10.5 mg/dL - 9.1  Alkaline Phos 25 - 125 U/L 55 -  AST 13 - 35 U/L 14 -  ALT 7 - 35 U/L 8 -    Assessment/plan  Old cva With left sided hemiplegia. Continue baby aspirin for now  Iron def anemia Continue ferrous sulfate 325 mg bid and check cbc next lab  Constipation Continue senna and hydration encouraged  gerd Symptom stable, d/c ranitidine for now and monitor   Oneal Grout, MD  [facility] [phone] (Monday-Friday 8 am - 5 pm) [phone] (afterhours)      Oneal Grout, MD  [facility] Adult Medicine [phone] (Monday-Friday 8 am - 5 pm) [phone] (afterhours)

## 2015-01-22 LAB — HEPATIC FUNCTION PANEL
ALT: 8 U/L (ref 7–35)
AST: 13 U/L (ref 13–35)
Alkaline Phosphatase: 65 U/L (ref 25–125)
Bilirubin, Total: 0.4 mg/dL

## 2015-01-22 LAB — BASIC METABOLIC PANEL
BUN: 26 mg/dL — AB (ref 4–21)
CREATININE: 1 mg/dL (ref <=1.1)
GLUCOSE: 83 mg/dL
POTASSIUM: 4.1 mmol/L (ref 3.4–5.3)
SODIUM: 140 mmol/L (ref 137–147)

## 2015-02-04 LAB — CBC AND DIFFERENTIAL
HCT: 38 % (ref 36–46)
Hemoglobin: 11.7 g/dL — AB (ref 12.0–16.0)
Platelets: 168 10*3/uL (ref 150–399)
WBC: 6.3 10*3/mL

## 2015-03-01 ENCOUNTER — Non-Acute Institutional Stay (SKILLED_NURSING_FACILITY): Payer: Medicare Other | Admitting: Internal Medicine

## 2015-03-01 ENCOUNTER — Encounter: Payer: Self-pay | Admitting: Internal Medicine

## 2015-03-01 DIAGNOSIS — Z1612 Extended spectrum beta lactamase (ESBL) resistance: Secondary | ICD-10-CM

## 2015-03-01 DIAGNOSIS — F329 Major depressive disorder, single episode, unspecified: Secondary | ICD-10-CM

## 2015-03-01 DIAGNOSIS — E440 Moderate protein-calorie malnutrition: Secondary | ICD-10-CM

## 2015-03-01 DIAGNOSIS — A499 Bacterial infection, unspecified: Secondary | ICD-10-CM

## 2015-03-01 DIAGNOSIS — I69359 Hemiplegia and hemiparesis following cerebral infarction affecting unspecified side: Secondary | ICD-10-CM | POA: Diagnosis not present

## 2015-03-01 LAB — BASIC METABOLIC PANEL
BUN: 22 mg/dL — AB (ref 4–21)
Creatinine: 0.9 mg/dL (ref 0.5–1.1)
Glucose: 79 mg/dL
Potassium: 4.6 mmol/L (ref 3.4–5.3)
Sodium: 142 mmol/L (ref 137–147)

## 2015-03-01 NOTE — Progress Notes (Signed)
Patient ID: Oliveah Zwack, female   DOB: 03/08/1931, 81 y.o.   MRN: [id number]      [facility]  Code Status: DNR  Chief Complaint  Patient presents with  . Medical Management of Chronic Issues    Routine Visit     Allergies  Allergen Reactions  . Penicillins     HPI 81 y/o female patient is seen for routine visit. No falls reported. No new skin concern. Patient denies any concerns. As per staff worsening of her confusion. Has hx of vascular dementia. Reviewed her urine culture report positive for ESBL UTI. Skin tear to posterior right forearm has resolved.   Review of Systems   Constitutional: Negative for fever HENT: Negative for congestion   Respiratory: Negative for cough, shortness of breath and wheezing.    Cardiovascular: Negative for chest pain, palpitations Gastrointestinal: Negative for heartburn, nausea, vomiting, abdominal pain   Psychiatric/Behavioral: positive for memory loss. Has dementia and depression    Past medical history reviewed    Medication List       This list is accurate as of: 03/01/15  4:00 PM.  Always use your most recent med list.               aspirin 81 MG tablet  Take 81 mg by mouth daily.     BIOFREEZE 4 % Gel  Generic drug:  Menthol (Topical Analgesic)  Apply 1 application topically 2 (two) times daily.     calcium-vitamin D 500-400 MG-UNIT tablet  Commonly known as:  OSCAL-500  Take 1 tablet by mouth 2 (two) times daily.     cholecalciferol 1000 UNITS tablet  Commonly known as:  VITAMIN D  Take 1,000 Units by mouth daily.     divalproex 125 MG DR tablet  Commonly known as:  DEPAKOTE  125 mg in the am and 250 mg in the pm     donepezil 10 MG tablet  Commonly known as:  ARICEPT  Take 10 mg by mouth daily.     famotidine 10 MG tablet  Commonly known as:  PEPCID  Take 10 mg by mouth daily as needed for heartburn or indigestion.     ferrous sulfate 325 (65 FE) MG tablet  Take 325 mg by mouth 2 (two)  times daily with a meal.     HYDROcodone-acetaminophen 5-325 MG tablet  Commonly known as:  NORCO/VICODIN  Take one tablet by mouth every 6 hours as needed for moderate pain;     lidocaine 5 %  Commonly known as:  LIDODERM  Place 1 patch onto the skin daily. Remove & Discard patch within 12 hours or as directed by MD     LORazepam 0.5 MG tablet  Commonly known as:  ATIVAN  Take 1/2 tablet by mouth every evening for anxiety; Take 1/2 tablet by mouth twice daily as needed for agitation/anxiety     polyvinyl alcohol 1.4 % ophthalmic solution  Commonly known as:  LIQUIFILM TEARS  1 drop 2 (two) times daily. Wait 3-5 minutes between 2 eye meds.     senna 8.6 MG Tabs tablet  Commonly known as:  SENOKOT  Take 2 tablets by mouth daily.     sertraline 100 MG tablet  Commonly known as:  ZOLOFT  Take 100 mg by mouth daily.     UNABLE TO FIND  Med Name: Med pass 90 mL by mouth four times daily for nutritional support        Physical exam  BP 113/77 mmHg  Pulse 98  Temp(Src) 98 F (36.7 C) (Oral)  Resp 24  SpO2 96%  Wt Readings from Last 3 Encounters:  10/05/14 131 lb 6.4 oz (59.603 kg)  09/09/14 133 lb 6.4 oz (60.51 kg)  08/13/14 134 lb (60.782 kg)   General- elderly female in no acute distress Eyes-  no pallor, no icterus, no discharge Neck- no lymphadenopathy Mouth- normal mucus membrane Cardiovascular- normal s1,s2, no murmurs, no leg edema Respiratory- bilateral clear to auscultation, no wheeze, no rhonchi, no crackles Abdomen- bowel sounds present, soft, non tender Skin- warm and dry Psychiatry- pleasantly confused with poor insight  Labs CBC Latest Ref Rng 02/04/2015 08/14/2014 04/07/2014  WBC - 6.3 5.2 6.9  Hemoglobin 12.0 - 16.0 g/dL 11.7(A) 11.9(A) 11.6(A)  Hematocrit 36 - 46 % 38 38 38  Platelets 150 - 399 K/L 168 175 180   CMP Latest Ref Rng 01/22/2015 08/14/2014 09/01/2010  Glucose 70 - 99 mg/dL - - 98  BUN 4 - 21 mg/dL 60(V) 37(T) 16  Creatinine .5 - 1.1  mg/dL 1.0 0.9 0.62  Sodium 694 - 147 mmol/L 140 140 137  Potassium 3.4 - 5.3 mmol/L 4.1 4.5 4.2  Chloride 96 - 112 mEq/L - - 101  CO2 19 - 32 mEq/L - - 26  Calcium 8.4 - 10.5 mg/dL - - 9.1  Alkaline Phos 25 - 125 U/L 65 55 -  AST 13 - 35 U/L 13 14 -  ALT 7 - 35 U/L 8 8 -    Assessment/plan  ESBL UTI Start ciprofloxacin 500 mg q12h x 1 week with florastor 250 mg bid x 2 weeks, maintain hydration, monitor temperature curve  Old cva With left sided hemiplegia. Continue baby aspirin for now  Depression Chronic, monitor clinically, continue zoloft and depakote current regimen, followed by psych services  Protein calorie malnutrition Continue medpass supplement for nutritional support, monitor weight, decline anticipated with her dementia   Oneal Grout, MD  [facility] Adult Medicine [phone] (Monday-Friday 8 am - 5 pm) [phone] (afterhours)      Oneal Grout, MD  [facility] Adult Medicine [phone] (Monday-Friday 8 am - 5 pm) [phone] (afterhours)

## 2015-04-05 ENCOUNTER — Non-Acute Institutional Stay (SKILLED_NURSING_FACILITY): Payer: Medicare Other | Admitting: Internal Medicine

## 2015-04-05 DIAGNOSIS — D509 Iron deficiency anemia, unspecified: Secondary | ICD-10-CM | POA: Diagnosis not present

## 2015-04-05 DIAGNOSIS — K219 Gastro-esophageal reflux disease without esophagitis: Secondary | ICD-10-CM

## 2015-04-05 DIAGNOSIS — M1710 Unilateral primary osteoarthritis, unspecified knee: Secondary | ICD-10-CM

## 2015-04-05 DIAGNOSIS — F0151 Vascular dementia with behavioral disturbance: Secondary | ICD-10-CM

## 2015-04-05 DIAGNOSIS — M179 Osteoarthritis of knee, unspecified: Secondary | ICD-10-CM

## 2015-04-05 DIAGNOSIS — F329 Major depressive disorder, single episode, unspecified: Secondary | ICD-10-CM | POA: Insufficient documentation

## 2015-04-05 DIAGNOSIS — E440 Moderate protein-calorie malnutrition: Secondary | ICD-10-CM | POA: Insufficient documentation

## 2015-04-05 DIAGNOSIS — F01518 Vascular dementia, unspecified severity, with other behavioral disturbance: Secondary | ICD-10-CM

## 2015-04-05 DIAGNOSIS — IMO0002 Reserved for concepts with insufficient information to code with codable children: Secondary | ICD-10-CM

## 2015-04-05 NOTE — Progress Notes (Signed)
Patient ID: Mallory Herring, female   DOB: October 03, 1930, 82 y.o.   MRN: [id number]      [facility]  Code Status: DNR  Chief Complaint  Patient presents with  . Medical Management of Chronic Issues    Allergies  Allergen Reactions  . Penicillins     HPI 82 y/o female patient is seen for routine visit. She has PMH of left sided hemiplegia from her CVA and is wheelchair bound. She has PMH of GERD, constipation, dementia with behavioral disorder. No falls reported. No new skin concern. She has completed antibiotics for ESB UTI. Her pain is under control with current regimen. Denies any heartburn.   Review of Systems   Constitutional: Negative for fever HENT: Negative for congestion   Respiratory: Negative for cough, shortness of breath and wheezing.    Cardiovascular: Negative for chest pain, palpitations Gastrointestinal: Negative for heartburn, nausea, vomiting, abdominal pain   Genitourinary: Negative for dysuria Neurological: Negative for dizziness, headaches.   Psychiatric/Behavioral: positive for memory loss. Has dementia and depression    Past medical history reviewed    Medication List       This list is accurate as of: 04/05/15  3:25 PM.  Always use your most recent med list.               aspirin 81 MG tablet  Take 81 mg by mouth daily.     BIOFREEZE 4 % Gel  Generic drug:  Menthol (Topical Analgesic)  Apply 1 application topically 2 (two) times daily.     calcium-vitamin D 500-400 MG-UNIT tablet  Commonly known as:  OSCAL-500  Take 1 tablet by mouth 2 (two) times daily.     cholecalciferol 1000 units tablet  Commonly known as:  VITAMIN D  Take 1,000 Units by mouth daily.     divalproex 125 MG DR tablet  Commonly known as:  DEPAKOTE  125 mg in the am and 250 mg in the pm     donepezil 10 MG tablet  Commonly known as:  ARICEPT  Take 10 mg by mouth daily.     famotidine 10 MG tablet  Commonly known as:  PEPCID  Take 10 mg by mouth  daily as needed for heartburn or indigestion.     ferrous sulfate 325 (65 FE) MG tablet  Take 325 mg by mouth 2 (two) times daily with a meal.     HYDROcodone-acetaminophen 5-325 MG tablet  Commonly known as:  NORCO/VICODIN  Take one tablet by mouth every 6 hours as needed for moderate pain;     lidocaine 5 %  Commonly known as:  LIDODERM  Place 1 patch onto the skin daily. Remove & Discard patch within 12 hours or as directed by MD     LORazepam 0.5 MG tablet  Commonly known as:  ATIVAN  Take 1/2 tablet by mouth every evening for anxiety; Take 1/2 tablet by mouth twice daily as needed for agitation/anxiety     polyvinyl alcohol 1.4 % ophthalmic solution  Commonly known as:  LIQUIFILM TEARS  1 drop 2 (two) times daily. Wait 3-5 minutes between 2 eye meds.     senna 8.6 MG Tabs tablet  Commonly known as:  SENOKOT  Take 2 tablets by mouth daily.     sertraline 100 MG tablet  Commonly known as:  ZOLOFT  Take 100 mg by mouth daily.     UNABLE TO FIND  Med Name: Med pass 90 mL by mouth four  times daily for nutritional support        Physical exam BP 126/72 mmHg  Pulse 73  Temp(Src) 97.1 F (36.2 C)  Resp 18  SpO2 97%  Wt Readings from Last 3 Encounters:  10/05/14 131 lb 6.4 oz (59.603 kg)  09/09/14 133 lb 6.4 oz (60.51 kg)  08/13/14 134 lb (60.782 kg)   General- elderly female in no acute distress Head- atraumatic, normocephalic Eyes- PERRLA, EOMI, no pallor, no icterus, no discharge Neck- no lymphadenopathy Mouth- normal mucus membrane Cardiovascular- normal s1,s2, no murmurs, no leg edema Respiratory- bilateral clear to auscultation, no wheeze, no rhonchi, no crackles Abdomen- bowel sounds present, soft, non tender Musculoskeletal- good grip strength in both arms, left arm abduction limited with left shoulder ROM limited, mild kyphosis, arthritis changes in her hand digits, on wheelchair, left sided weakness> right side Skin- warm and dry Psychiatry- pleasantly  confused with poor insight  Labs CBC Latest Ref Rng 02/04/2015 08/14/2014 04/07/2014  WBC - 6.3 5.2 6.9  Hemoglobin 12.0 - 16.0 g/dL 11.7(A) 11.9(A) 11.6(A)  Hematocrit 36 - 46 % 38 38 38  Platelets 150 - 399 K/L 168 175 180   CMP Latest Ref Rng 01/22/2015 08/14/2014 09/01/2010  Glucose 70 - 99 mg/dL - - 98  BUN 4 - 21 mg/dL 39(J) 67(H) 16  Creatinine .5 - 1.1 mg/dL 1.0 0.9 4.19  Sodium 379 - 147 mmol/L 140 140 137  Potassium 3.4 - 5.3 mmol/L 4.1 4.5 4.2  Chloride 96 - 112 mEq/L - - 101  CO2 19 - 32 mEq/L - - 26  Calcium 8.4 - 10.5 mg/dL - - 9.1  Alkaline Phos 25 - 125 U/L 65 55 -  AST 13 - 35 U/L 13 14 -  ALT 7 - 35 U/L 8 8 -    Assessment/plan  gerd Denies any symptom, currently on prn pepcid, discontinue this for now  OA Stable, continue lidocaine patch and change her norco 5-325 mg 1-2 tab q8h prn to 1 tab q8h prn only and monitor  Iron def anemia Continue ferrous sulfate 325 mg bid, reviewed HB, change aspirin to [facility] for now  Dementia with behavioral disturbance Continue aricept with current regimen of depakote, zoloft and ativan   Oneal Grout, MD  [facility] Adult Medicine [phone] (Monday-Friday 8 am - 5 pm) [phone] (afterhours)      Oneal Grout, MD  [facility] Adult Medicine [phone] (Monday-Friday 8 am - 5 pm) [phone] (afterhours)

## 2015-05-07 ENCOUNTER — Encounter: Payer: Self-pay | Admitting: Internal Medicine

## 2015-05-07 ENCOUNTER — Non-Acute Institutional Stay (SKILLED_NURSING_FACILITY): Payer: Medicare Other | Admitting: Internal Medicine

## 2015-05-07 DIAGNOSIS — E440 Moderate protein-calorie malnutrition: Secondary | ICD-10-CM

## 2015-05-07 DIAGNOSIS — F329 Major depressive disorder, single episode, unspecified: Secondary | ICD-10-CM

## 2015-05-07 DIAGNOSIS — I69319 Unspecified symptoms and signs involving cognitive functions following cerebral infarction: Secondary | ICD-10-CM | POA: Diagnosis not present

## 2015-05-07 DIAGNOSIS — K5900 Constipation, unspecified: Secondary | ICD-10-CM | POA: Diagnosis not present

## 2015-05-07 DIAGNOSIS — K5909 Other constipation: Secondary | ICD-10-CM

## 2015-05-07 NOTE — Progress Notes (Signed)
Patient ID: Mallory Herring, female   DOB: [birth year], 82 y.o.   MRN: [id number]      [facility]  Code Status: DNR  Chief Complaint  Patient presents with  . Medical Management of Chronic Issues    Routine Visit    Allergies  Allergen Reactions  . Penicillins    Advanced Directives 03/01/2015  Does patient have an advance directive? Yes  Type of Advance Directive Out of facility DNR (pink MOST or yellow form)  Does patient want to make changes to advanced directive? No - Patient declined  Copy of advanced directive(s) in chart? Yes   Extended Emergency Contact Information Primary Emergency Contact: Reeves,Phillip Address: [address]           [address] [place] Home Phone: [phone] Work Phone: [phone] Mobile Phone: [phone] Relation: Nephew Secondary Emergency Contact: Reeves,Clara Address: [address]          [place], [place] [address] [place] Home Phone: [phone] Mobile Phone: [phone] Relation: Niece    HPI 82 y/o female patient is seen for routine visit. She has been at her baseline. She is followed by psych services. She has PMH of left sided hemiplegia from her CVA and is wheelchair bound. No falls reported. No new skin concern. Has lost weight.    Review of Systems   Constitutional: Negative for fever HENT: Negative for congestion   Respiratory: Negative for cough, shortness of breath and wheezing.    Cardiovascular: Negative for chest pain, palpitations Gastrointestinal: Negative for heartburn, nausea, vomiting, abdominal pain   Genitourinary: Negative for dysuria Neurological: Negative for dizziness, headaches.   Psychiatric/Behavioral: positive for memory loss. Has dementia and depression    Past medical history reviewed    Medication List       This list is accurate as of: 05/07/15 12:16 PM.  Always use your most recent med list.               aspirin  81 MG tablet  Take 81 mg by mouth daily.     BIOFREEZE 4 % Gel  Generic drug:  Menthol (Topical Analgesic)  Apply 1 application topically 2 (two) times daily.     calcium-vitamin D 500-400 MG-UNIT tablet  Commonly known as:  OSCAL-500  Take 1 tablet by mouth 2 (two) times daily.     cholecalciferol 1000 units tablet  Commonly known as:  VITAMIN D  Take 1,000 Units by mouth daily.     divalproex 125 MG DR tablet  Commonly known as:  DEPAKOTE  125 mg in the am and 250 mg in the pm     donepezil 10 MG tablet  Commonly known as:  ARICEPT  Take 10 mg by mouth daily.     ferrous sulfate 325 (65 FE) MG tablet  Take 325 mg by mouth 2 (two) times daily with a meal.     HYDROcodone-acetaminophen 5-325 MG tablet  Commonly known as:  NORCO/VICODIN  Take 1 tablet by mouth every 8 (eight) hours as needed for severe pain. Take one tablet by mouth every 6 hours as needed for moderate pain;     lidocaine 5 %  Commonly known as:  LIDODERM  Place 1 patch onto the skin daily. Remove & Discard patch within 12 hours or as directed by MD     LORazepam 0.5 MG tablet  Commonly known as:  ATIVAN  Take 1/2 tablet by mouth every evening for anxiety;  Take 1/2 tablet by mouth twice daily as needed for agitation/anxiety     polyvinyl alcohol 1.4 % ophthalmic solution  Commonly known as:  LIQUIFILM TEARS  1 drop 2 (two) times daily. Wait 3-5 minutes between 2 eye meds.     senna 8.6 MG Tabs tablet  Commonly known as:  SENOKOT  Take 2 tablets by mouth daily.     sertraline 100 MG tablet  Commonly known as:  ZOLOFT  Take 100 mg by mouth daily.     UNABLE TO FIND  Med Name: Med pass 90 mL by mouth four times daily for nutritional support     UNABLE TO FIND  Med Name: Mineral oil. Administer 5 drops into each ear every night at bedtime        Physical exam BP 113/68 mmHg  Pulse 74  Temp(Src) 97.5 F (36.4 C) (Oral)  Resp 20  Ht 5\' 5"  (1.651 m)  Wt 124 lb 6.4 oz (56.427 kg)  BMI 20.70  kg/m2  SpO2 95%  Wt Readings from Last 3 Encounters:  05/07/15 124 lb 6.4 oz (56.427 kg)  10/05/14 131 lb 6.4 oz (59.603 kg)  09/09/14 133 lb 6.4 oz (60.51 kg)   General- elderly thin built, frail, female in no acute distress Head- atraumatic, normocephalic Eyes- PERRLA, EOMI, no pallor, no icterus, no discharge Neck- no lymphadenopathy Mouth- normal mucus membrane Cardiovascular- normal s1,s2, no murmurs, no leg edema Respiratory- bilateral clear to auscultation, no wheeze, no rhonchi, no crackles Abdomen- bowel sounds present, soft, non tender Musculoskeletal- good grip strength in both arms, left arm abduction limited with left shoulder ROM limited, mild kyphosis, arthritis changes in her hand digits, on wheelchair, left sided weakness> right side Skin- warm and dry Psychiatry- pleasantly confused with poor insight  Labs CBC Latest Ref Rng 02/04/2015 08/14/2014 04/07/2014  WBC - 6.3 5.2 6.9  Hemoglobin 12.0 - 16.0 g/dL 11.7(A) 11.9(A) 11.6(A)  Hematocrit 36 - 46 % 38 38 38  Platelets 150 - 399 K/L 168 175 180   CMP Latest Ref Rng 03/01/2015 01/22/2015 08/14/2014  Glucose 70 - 99 mg/dL - - -  BUN 4 - 21 mg/dL 08/16/2014) 24(Q) 68(T)  Creatinine 0.5 - 1.1 mg/dL 0.9 1.0 0.9  Sodium 41(D - 147 mmol/L 142 140 140  Potassium 3.4 - 5.3 mmol/L 4.6 4.1 4.5  Chloride 96 - 112 mEq/L - - -  CO2 19 - 32 mEq/L - - -  Calcium 8.4 - 10.5 mg/dL - - -  Alkaline Phos 25 - 125 U/L - 65 55  AST 13 - 35 U/L - 13 14  ALT 7 - 35 U/L - 8 8    Assessment/plan  Protein calorie malnutrition Continue medpass supplement for nutritional support, monitor weight, decline anticipated with her dementia. Pressure ulcer prophylaxis.  Major depression With her dementia. Continue aricept with current regimen of depakote, zoloft and ativan  Old cva With left sided hemiplegia. Continue baby aspirin EC for now. Fall precautions and assistance with ADLs  Constipation Stable with current bowel regimen for now.  Continue senokot 2 tab daily   622, MD  [facility] [phone] (Monday-Friday 8 am - 5 pm) [phone] (afterhours)

## 2015-05-21 ENCOUNTER — Other Ambulatory Visit (INDEPENDENT_AMBULATORY_CARE_PROVIDER_SITE_OTHER): Payer: Self-pay | Admitting: Otolaryngology

## 2015-05-21 DIAGNOSIS — R1310 Dysphagia, unspecified: Secondary | ICD-10-CM

## 2015-05-27 ENCOUNTER — Ambulatory Visit (HOSPITAL_COMMUNITY): Payer: Medicare Other

## 2015-06-04 ENCOUNTER — Encounter: Payer: Self-pay | Admitting: Internal Medicine

## 2015-06-04 ENCOUNTER — Non-Acute Institutional Stay (SKILLED_NURSING_FACILITY): Payer: Medicare Other | Admitting: Internal Medicine

## 2015-06-04 DIAGNOSIS — F418 Other specified anxiety disorders: Secondary | ICD-10-CM

## 2015-06-04 DIAGNOSIS — F0151 Vascular dementia with behavioral disturbance: Secondary | ICD-10-CM | POA: Diagnosis not present

## 2015-06-04 DIAGNOSIS — I69354 Hemiplegia and hemiparesis following cerebral infarction affecting left non-dominant side: Secondary | ICD-10-CM

## 2015-06-04 NOTE — Progress Notes (Signed)
Patient ID: Mallory Herring, female   DOB: 06/20/1930, 82 y.o.   MRN: [id number]      [facility]  Code Status: DNR  Chief Complaint  Patient presents with  . Medical Management of Chronic Issues    Routine Visit    Allergies  Allergen Reactions  . Penicillins    Advanced Directives 03/01/2015  Does patient have an advance directive? Yes  Type of Advance Directive Out of facility DNR (pink MOST or yellow form)  Does patient want to make changes to advanced directive? No - Patient declined  Copy of advanced directive(s) in chart? Yes   Extended Emergency Contact Information Primary Emergency Contact: Reeves,Phillip Address: [address]          [place] [address] [place] Home Phone: [phone] Work Phone: [phone] Mobile Phone: [phone] Relation: Nephew Secondary Emergency Contact: Reeves,Clara Address: [address]          [place], [place] [address] [place] Home Phone: [phone] Mobile Phone: [phone] Relation: Niece    HPI 82 y/o female patient is seen for routine visit. No new concern from patient and staff. No falls reported. No new skin concern. She continues to lose weight.    Review of Systems   Constitutional: Negative for fever HENT: Negative for congestion   Respiratory: Negative for cough, shortness of breath and wheezing.    Cardiovascular: Negative for chest pain, palpitations Gastrointestinal: Negative for heartburn, nausea, vomiting, abdominal pain   Genitourinary: Negative for dysuria Neurological: Negative for dizziness, headaches.   Psychiatric/Behavioral: positive for memory loss. Has dementia and depression    Past medical history reviewed    Medication List       This list is accurate as of: 06/04/15  3:35 PM.  Always use your most recent med list.               aspirin 81 MG tablet  Take 81 mg by mouth daily.     BIOFREEZE 4 % Gel  Generic drug:   Menthol (Topical Analgesic)  Apply 1 application topically 2 (two) times daily.     calcium-vitamin D 500-400 MG-UNIT tablet  Commonly known as:  OSCAL-500  Take 1 tablet by mouth 2 (two) times daily.     cholecalciferol 1000 units tablet  Commonly known as:  VITAMIN D  Take 1,000 Units by mouth daily.     divalproex 125 MG DR tablet  Commonly known as:  DEPAKOTE  125 mg in the am and 250 mg in the pm     donepezil 10 MG tablet  Commonly known as:  ARICEPT  Take 10 mg by mouth daily.     ferrous sulfate 325 (65 FE) MG tablet  Take 325 mg by mouth 2 (two) times daily with a meal.     HYDROcodone-acetaminophen 5-325 MG tablet  Commonly known as:  NORCO/VICODIN  Take 1 tablet by mouth every 8 (eight) hours as needed for severe pain. Take one tablet by mouth every 6 hours as needed for moderate pain;     lidocaine 5 %  Commonly known as:  LIDODERM  Place 1 patch onto the skin daily. Remove & Discard patch within 12 hours or as directed by MD     LORazepam 0.5 MG tablet  Commonly known as:  ATIVAN  Take 1/2 tablet by mouth every evening for anxiety; Take 1/2 tablet by mouth twice daily as needed for agitation/anxiety     polyvinyl  alcohol 1.4 % ophthalmic solution  Commonly known as:  LIQUIFILM TEARS  1 drop 2 (two) times daily. Wait 3-5 minutes between 2 eye meds.     senna 8.6 MG Tabs tablet  Commonly known as:  SENOKOT  Take 2 tablets by mouth daily.     sertraline 100 MG tablet  Commonly known as:  ZOLOFT  Take 100 mg by mouth daily.     UNABLE TO FIND  Med Name: Med pass 90 mL by mouth four times daily for nutritional support     UNABLE TO FIND  Med Name: Mineral oil. Administer 5 drops into each ear every night at bedtime        Physical exam BP 113/73 mmHg  Pulse 80  Temp(Src) 97.8 F (36.6 C) (Oral)  Resp 20  Ht 5\' 5"  (1.651 m)  Wt 121 lb (54.885 kg)  BMI 20.14 kg/m2  SpO2 90%  Wt Readings from Last 3 Encounters:  06/04/15 121 lb (54.885 kg)    05/07/15 124 lb 6.4 oz (56.427 kg)  10/05/14 131 lb 6.4 oz (59.603 kg)   General- elderly thin built, frail, female in no acute distress Head- atraumatic, normocephalic Eyes- PERRLA, EOMI, no pallor, no icterus, no discharge Neck- no lymphadenopathy Mouth- normal mucus membrane Cardiovascular- normal s1,s2, no murmurs, no leg edema Respiratory- bilateral clear to auscultation, no wheeze, no rhonchi, no crackles Abdomen- bowel sounds present, soft, non tender Musculoskeletal- good grip strength in both arms, mild kyphosis, arthritis changes in her hand digits, on wheelchair, left sided weakness> right side Skin- warm and dry Psychiatry- pleasantly confused with poor insight  Labs CBC Latest Ref Rng 02/04/2015 08/14/2014 04/07/2014  WBC - 6.3 5.2 6.9  Hemoglobin 12.0 - 16.0 g/dL 11.7(A) 11.9(A) 11.6(A)  Hematocrit 36 - 46 % 38 38 38  Platelets 150 - 399 K/L 168 175 180   CMP Latest Ref Rng 03/01/2015 01/22/2015 08/14/2014  Glucose 70 - 99 mg/dL - - -  BUN 4 - 21 mg/dL 08/16/2014) 45(X) 64(W)  Creatinine 0.5 - 1.1 mg/dL 0.9 1.0 0.9  Sodium 80(H - 147 mmol/L 142 140 140  Potassium 3.4 - 5.3 mmol/L 4.6 4.1 4.5  Chloride 96 - 112 mEq/L - - -  CO2 19 - 32 mEq/L - - -  Calcium 8.4 - 10.5 mg/dL - - -  Alkaline Phos 25 - 125 U/L - 65 55  AST 13 - 35 U/L - 13 14  ALT 7 - 35 U/L - 8 8    Assessment/plan  Vascular dementia Continue aricept for now with supportive care.   Depression with anxiety Continue depakote and zoloft with ativan for now and monitor, followed by psych services  CVA Continue aspirin for now. With left hemiparesis, continue fall precautions and assistance with her ADLs.     212, MD Internal Medicine [facility] [address] [place], [place] Cell Phone (Monday-Friday 8 am - 5 pm): [phone] On Call: [phone] and follow prompts after 5 pm and on weekends Office Phone: [phone] Office Fax:  [phone]

## 2015-07-06 ENCOUNTER — Non-Acute Institutional Stay (SKILLED_NURSING_FACILITY): Payer: Medicare Other | Admitting: Internal Medicine

## 2015-07-06 ENCOUNTER — Encounter: Payer: Self-pay | Admitting: Internal Medicine

## 2015-07-06 DIAGNOSIS — D509 Iron deficiency anemia, unspecified: Secondary | ICD-10-CM

## 2015-07-06 DIAGNOSIS — M818 Other osteoporosis without current pathological fracture: Secondary | ICD-10-CM | POA: Diagnosis not present

## 2015-07-06 DIAGNOSIS — I69319 Unspecified symptoms and signs involving cognitive functions following cerebral infarction: Secondary | ICD-10-CM | POA: Diagnosis not present

## 2015-07-06 DIAGNOSIS — M75101 Unspecified rotator cuff tear or rupture of right shoulder, not specified as traumatic: Secondary | ICD-10-CM | POA: Diagnosis not present

## 2015-07-06 DIAGNOSIS — F4323 Adjustment disorder with mixed anxiety and depressed mood: Secondary | ICD-10-CM | POA: Insufficient documentation

## 2015-07-06 DIAGNOSIS — K5901 Slow transit constipation: Secondary | ICD-10-CM | POA: Diagnosis not present

## 2015-07-06 DIAGNOSIS — F0151 Vascular dementia with behavioral disturbance: Secondary | ICD-10-CM

## 2015-07-06 DIAGNOSIS — H04123 Dry eye syndrome of bilateral lacrimal glands: Secondary | ICD-10-CM

## 2015-07-06 DIAGNOSIS — E559 Vitamin D deficiency, unspecified: Secondary | ICD-10-CM | POA: Diagnosis not present

## 2015-07-06 DIAGNOSIS — E440 Moderate protein-calorie malnutrition: Secondary | ICD-10-CM

## 2015-07-06 DIAGNOSIS — Z0000 Encounter for general adult medical examination without abnormal findings: Secondary | ICD-10-CM

## 2015-07-06 DIAGNOSIS — M75100 Unspecified rotator cuff tear or rupture of unspecified shoulder, not specified as traumatic: Secondary | ICD-10-CM | POA: Insufficient documentation

## 2015-07-06 DIAGNOSIS — H04129 Dry eye syndrome of unspecified lacrimal gland: Secondary | ICD-10-CM | POA: Insufficient documentation

## 2015-07-06 DIAGNOSIS — I69354 Hemiplegia and hemiparesis following cerebral infarction affecting left non-dominant side: Secondary | ICD-10-CM

## 2015-07-06 DIAGNOSIS — M19011 Primary osteoarthritis, right shoulder: Secondary | ICD-10-CM

## 2015-07-06 DIAGNOSIS — F01518 Vascular dementia, unspecified severity, with other behavioral disturbance: Secondary | ICD-10-CM

## 2015-07-06 NOTE — Progress Notes (Signed)
Patient ID: Mallory Herring, female   DOB: 07-10-1930, 82 y.o.   MRN: [id number]      [facility]  Code Status: DNR  Chief Complaint  Patient presents with  . Annual Exam    Annual Visit    Allergies  Allergen Reactions  . Penicillins    Advanced Directives 03/01/2015  Does patient have an advance directive? Yes  Type of Advance Directive Out of facility DNR (pink MOST or yellow form)  Does patient want to make changes to advanced directive? No - Patient declined  Copy of advanced directive(s) in chart? Yes   Extended Emergency Contact Information Primary Emergency Contact: Reeves,Phillip Address: [address]          [place] [address] [place] Home Phone: [phone] Work Phone: [phone] Mobile Phone: [phone] Relation: Nephew Secondary Emergency Contact: Reeves,Clara Address: [address]          [place], [place] [address] [place] Home Phone: [phone] Mobile Phone: [phone] Relation: Niece    HPI 82 y/o female patient is seen for annual visit. She denies any concern. No new concern from staff. No falls reported. No new skin concern. She continues to lose weight.    Review of Systems  Constitutional: Negative for fever, chills, diaphoresis.  HENT: Negative for congestion, hearing loss and sore throat.   Eyes: Negative for blurred vision, double vision and discharge.  Respiratory: Negative for cough, sputum production, shortness of breath and wheezing.   Cardiovascular: Negative for chest pain, palpitations, orthopnea and leg swelling.  Gastrointestinal: Negative for heartburn, nausea, vomiting, abdominal pain, diarrhea and constipation.  Genitourinary: Negative for dysuria, flank pain.  Musculoskeletal: Negative for back pain, falls. Skin: Negative for itching and rash.  Neurological: Negative for dizziness and headaches.  Psychiatric/Behavioral: positive for memory loss.   Past  Medical History  Diagnosis Date  . Dementia   . CVA (cerebral infarction)   . Hemiparesis (HCC)     right  . Dysphagia   . Osteoporosis   . Hyperlipemia   . Anemia   . Anxiety   . Depression   . Difficulty in walking(719.7)   . Muscle weakness (generalized)   . Personal history of traumatic brain injury   . Personal history of fall   . Aftercare for healing traumatic fracture of hip        Medication List       This list is accurate as of: 07/06/15  4:18 PM.  Always use your most recent med list.               aspirin 81 MG tablet  Take 81 mg by mouth daily.     BIOFREEZE 4 % Gel  Generic drug:  Menthol (Topical Analgesic)  Apply 1 application topically 2 (two) times daily.     calcium-vitamin D 500-400 MG-UNIT tablet  Commonly known as:  OSCAL-500  Take 1 tablet by mouth 2 (two) times daily.     cholecalciferol 1000 units tablet  Commonly known as:  VITAMIN D  Take 1,000 Units by mouth daily.     divalproex 125 MG DR tablet  Commonly known as:  DEPAKOTE  125 mg in the am and 250 mg in the pm     donepezil 10 MG tablet  Commonly known as:  ARICEPT  Take 10 mg by mouth daily.     ferrous sulfate 325 (65 FE) MG tablet  Take 325 mg by mouth 2 (  two) times daily with a meal.     HYDROcodone-acetaminophen 5-325 MG tablet  Commonly known as:  NORCO/VICODIN  Take 1 tablet by mouth every 8 (eight) hours as needed for severe pain. Take one tablet by mouth every 6 hours as needed for moderate pain;     lidocaine 5 %  Commonly known as:  LIDODERM  Place 1 patch onto the skin daily. Remove & Discard patch within 12 hours or as directed by MD     LORazepam 0.5 MG tablet  Commonly known as:  ATIVAN  Take by mouth 2 (two) times daily. Take 1/2 tablet= 0.25 mg by mouth twice daily. Take 1/2 tablet in the evening     polyvinyl alcohol 1.4 % ophthalmic solution  Commonly known as:  LIQUIFILM TEARS  1 drop 2 (two) times daily. Wait 3-5 minutes between 2 eye meds.       senna 8.6 MG Tabs tablet  Commonly known as:  SENOKOT  Take 2 tablets by mouth daily.     sertraline 100 MG tablet  Commonly known as:  ZOLOFT  Take 100 mg by mouth daily.     UNABLE TO FIND  Med Name: Med pass 90 mL by mouth four times daily for nutritional support     UNABLE TO FIND  Med Name: Mineral oil. Administer 5 drops into each ear every night at bedtime       Immunization History  Administered Date(s) Administered  . Influenza-Unspecified 12/26/2013, 12/31/2014  . PPD Test 07/01/2012, 01/21/2014, 12/31/2014  . Pneumococcal-Unspecified 01/09/2014    Physical exam BP 104/65 mmHg  Pulse 73  Temp(Src) 98.3 F (36.8 C) (Oral)  Resp 18  Ht 5\' 5"  (1.651 m)  Wt 119 lb 3.2 oz (54.069 kg)  BMI 19.84 kg/m2  SpO2 93%  Wt Readings from Last 3 Encounters:  07/06/15 119 lb 3.2 oz (54.069 kg)  06/04/15 121 lb (54.885 kg)  05/07/15 124 lb 6.4 oz (56.427 kg)   General- elderly thin built, frail, female in no acute distress Head- atraumatic, normocephalic Eyes- PERRLA, EOMI, no pallor, no icterus, no discharge Neck- no lymphadenopathy Ear- normal ear canal Mouth- normal mucus membrane, normal oropharynx Cardiovascular- normal s1,s2, no murmurs, no leg edema Respiratory- bilateral clear to auscultation, no wheeze, no rhonchi, no crackles Abdomen- bowel sounds present, soft, non tender Musculoskeletal- good grip strength in both arms, mild kyphosis, arthritis changes in her hand digits, on wheelchair, left sided weakness> right side Skin- warm and dry Psychiatry- pleasantly confused with poor insight  Labs CBC Latest Ref Rng 02/04/2015 08/14/2014 04/07/2014  WBC - 6.3 5.2 6.9  Hemoglobin 12.0 - 16.0 g/dL 11.7(A) 11.9(A) 11.6(A)  Hematocrit 36 - 46 % 38 38 38  Platelets 150 - 399 K/L 168 175 180   CMP Latest Ref Rng 03/01/2015 01/22/2015 08/14/2014  Glucose 70 - 99 mg/dL - - -  BUN 4 - 21 mg/dL 08/16/2014) 10(F) 75(Z)  Creatinine 0.5 - 1.1 mg/dL 0.9 1.0 0.9  Sodium 02(H -  147 mmol/L 142 140 140  Potassium 3.4 - 5.3 mmol/L 4.6 4.1 4.5  Chloride 96 - 112 mEq/L - - -  CO2 19 - 32 mEq/L - - -  Calcium 8.4 - 10.5 mg/dL - - -  Alkaline Phos 25 - 125 U/L - 65 55  AST 13 - 35 U/L - 13 14  ALT 7 - 35 U/L - 8 8    Assessment/plan  Annual exam No acute change reported. She has been at her  baseline. Immunizations are uptodate. Will need lab work for review. Continue assistance with her ADLs  Moderate Protein calorie malnutrition Losing weight. Decline anticipated with her dementia. Monitor po intake and weight  Dementia with behavior disturbance Continue aricept for now with supportive care.   Iron def anemia Check cbc. Continue ferrous sulfate 325 mg twice daily  Slow transit Constipation Stable with current bowel regimen for now. Continue senokot 2 tab daily  Mood disorder Continue depakote and zoloft with ativan for now and monitor, followed by psych services  Old CVA Continue aspirin for now. With left hemiparesis, continue fall precautions and assistance with her ADLs.   Left sided hemiparesis Stable. Has not required norco. Discontinue this. have her on tylenol 650 mg q8h prn pain.   Vitamin d def Check vitamin d level, continue vitamin d supplement  Right shoulder rotator cuff tear Continue lidocaine patch and biofreeze. D/c norco and start tylenol as above  Dry eyes Continue liquifilm tears bid  Osteoporosis Determined by xray. Continue calcium and vitamin d supplement  PVD Continue baby aspirin. Check lipid panel  OA Continue pain management as above   Oneal Grout, MD Internal Medicine [facility] [address] [place], [place] [address] Cell Phone (Monday-Friday 8 am - 5 pm): [phone] On Call: [phone] and follow prompts after 5 pm and on weekends Office Phone: [phone] Office Fax: [phone]

## 2015-07-07 LAB — CBC AND DIFFERENTIAL
HCT: 39 % (ref 36–46)
Hemoglobin: 11.5 g/dL — AB (ref 12.0–16.0)
Platelets: 210 10*3/uL (ref 150–399)
WBC: 5.6 10^3/mL

## 2015-07-07 LAB — LIPID PANEL
Cholesterol: 156 mg/dL (ref 0–200)
HDL: 41 mg/dL (ref 35–70)
LDL Cholesterol: 93 mg/dL
TRIGLYCERIDES: 112 mg/dL (ref 40–160)

## 2015-07-07 LAB — HEPATIC FUNCTION PANEL
ALK PHOS: 64 U/L (ref 25–125)
ALT: 8 U/L (ref 7–35)
AST: 13 U/L (ref 13–35)
BILIRUBIN, TOTAL: 0.4 mg/dL

## 2015-07-07 LAB — BASIC METABOLIC PANEL
BUN: 23 mg/dL — AB (ref 4–21)
CREATININE: 0.9 mg/dL (ref 0.5–1.1)
Glucose: 74 mg/dL
Potassium: 4.1 mmol/L (ref 3.4–5.3)
SODIUM: 141 mmol/L (ref 137–147)

## 2015-07-07 LAB — TSH: TSH: 3.31 u[IU]/mL (ref 0.41–5.90)

## 2015-08-25 ENCOUNTER — Encounter: Payer: Self-pay | Admitting: Internal Medicine

## 2015-08-25 ENCOUNTER — Non-Acute Institutional Stay (SKILLED_NURSING_FACILITY): Payer: Medicare Other | Admitting: Internal Medicine

## 2015-08-25 DIAGNOSIS — F03918 Unspecified dementia, unspecified severity, with other behavioral disturbance: Secondary | ICD-10-CM

## 2015-08-25 DIAGNOSIS — S42212A Unspecified displaced fracture of surgical neck of left humerus, initial encounter for closed fracture: Secondary | ICD-10-CM | POA: Diagnosis not present

## 2015-08-25 DIAGNOSIS — W19XXXA Unspecified fall, initial encounter: Secondary | ICD-10-CM

## 2015-08-25 DIAGNOSIS — F0391 Unspecified dementia with behavioral disturbance: Secondary | ICD-10-CM

## 2015-08-25 DIAGNOSIS — R0789 Other chest pain: Secondary | ICD-10-CM | POA: Diagnosis not present

## 2015-08-25 NOTE — Progress Notes (Signed)
Patient ID: Mallory Herring, female   DOB: [birth year], 82 y.o.   MRN: [id number]      [facility]  Code Status: DNR  Chief Complaint  Patient presents with  . Medical Management of Chronic Issues    Routine Visit    Allergies  Allergen Reactions  . Penicillins    Advanced Directives 08/25/2015  Does patient have an advance directive? Yes  Type of Advance Directive Out of facility DNR (pink MOST or yellow form)  Does patient want to make changes to advanced directive? No - Patient declined  Copy of advanced directive(s) in chart? Yes   Extended Emergency Contact Information Primary Emergency Contact: Reeves,Phillip Address: [address]          [place] [address] [place] Home Phone: [phone] Work Phone: [phone] Mobile Phone: [phone] Relation: Nephew Secondary Emergency Contact: Reeves,Clara Address: [address]          [place], [place] [address] [place] Home Phone: [phone] Mobile Phone: [phone] Relation: Niece    HPI 82 y/o female patient is seen for routine visit. She had a fall last night per nursing staff. The provider on call was not notified. This am, this was brought to provider's attention. Patient was complaining of left arm pain with limited movement and left chest wall pain with movement and breathing. Xrays were ordered and has resulted. She complaints of pain to me. She has advanced dementia. denies any other concern.    Review of Systems  Constitutional: Negative for fever, chills, diaphoresis.  HENT: Negative for congestion, hearing loss and sore throat.   Eyes: Negative for blurred vision and discharge.  Respiratory: Negative for cough, sputum production, shortness of breath and wheezing.   Cardiovascular: Negative for chest pain, palpitations, orthopnea and leg swelling.  Gastrointestinal: Negative for heartburn, nausea, vomiting, abdominal pain, diarrhea and  constipation.  Genitourinary: Negative for dysuria, flank pain.  Musculoskeletal: Negative for back pain Skin: Negative for itching and rash.  Neurological: Negative for dizziness Psychiatric/Behavioral: positive for memory loss.   Past Medical History  Diagnosis Date  . Dementia   . CVA (cerebral infarction)   . Hemiparesis (HCC)     right  . Dysphagia   . Osteoporosis   . Hyperlipemia   . Anemia   . Anxiety   . Depression   . Difficulty in walking(719.7)   . Muscle weakness (generalized)   . Personal history of traumatic brain injury   . Personal history of fall   . Aftercare for healing traumatic fracture of hip        Medication List       This list is accurate as of: 08/25/15  4:10 PM.  Always use your most recent med list.               acetaminophen 325 MG tablet  Commonly known as:  TYLENOL  Take 650 mg by mouth every 8 (eight) hours as needed for mild pain.     aspirin 81 MG tablet  Take 81 mg by mouth daily.     BIOFREEZE 4 % Gel  Generic drug:  Menthol (Topical Analgesic)  Apply 1 application topically 2 (two) times daily.     calcium-vitamin D 500-400 MG-UNIT tablet  Commonly known as:  OSCAL-500  Take 1 tablet by mouth 2 (two) times daily.     cholecalciferol 1000 units tablet  Commonly known as:  VITAMIN D  Take 1,000 Units by  mouth daily.     divalproex 125 MG DR tablet  Commonly known as:  DEPAKOTE  125 mg in the am and 250 mg in the pm     donepezil 10 MG tablet  Commonly known as:  ARICEPT  Take 10 mg by mouth daily.     ferrous sulfate 325 (65 FE) MG tablet  Take 325 mg by mouth 2 (two) times daily with a meal.     lidocaine 5 %  Commonly known as:  LIDODERM  Place 1 patch onto the skin daily. Remove & Discard patch within 12 hours or as directed by MD     LORazepam 0.5 MG tablet  Commonly known as:  ATIVAN  Take by mouth 2 (two) times daily as needed. Take 1/2 tablet= 0.25 mg by mouth twice daily. Take 1/2 tablet in the  evening     polyvinyl alcohol 1.4 % ophthalmic solution  Commonly known as:  LIQUIFILM TEARS  1 drop 2 (two) times daily. Wait 3-5 minutes between 2 eye meds.     senna 8.6 MG Tabs tablet  Commonly known as:  SENOKOT  Take 2 tablets by mouth daily.     sertraline 100 MG tablet  Commonly known as:  ZOLOFT  Take 100 mg by mouth daily.     UNABLE TO FIND  Med Name: Med pass 90 mL by mouth four times daily for nutritional support     UNABLE TO FIND  Med Name: Mineral oil. Administer 5 drops into each ear every night at bedtime       Immunization History  Administered Date(s) Administered  . Influenza-Unspecified 12/26/2013, 12/31/2014  . PPD Test 07/01/2012, 01/21/2014, 12/31/2014  . Pneumococcal-Unspecified 01/09/2014    Physical exam BP 133/77 mmHg  Pulse 75  Temp(Src) 97.6 F (36.4 C) (Oral)  Resp 20  Ht 5\' 5"  (1.651 m)  Wt 120 lb 6.4 oz (54.613 kg)  BMI 20.04 kg/m2  SpO2 94%  Wt Readings from Last 3 Encounters:  08/25/15 120 lb 6.4 oz (54.613 kg)  07/06/15 119 lb 3.2 oz (54.069 kg)  06/04/15 121 lb (54.885 kg)   General- elderly thin built, frail, female in no acute distress Head- atraumatic, normocephalic Eyes- PERRLA, EOMI, no pallor, no icterus, no discharge Neck- no lymphadenopathy Mouth- normal mucus membrane Chest wall- no reproducible chest wall pain Cardiovascular- normal s1,s2, no murmurs, no leg edema Respiratory- bilateral clear to auscultation, no wheeze, no rhonchi, no crackles Abdomen- bowel sounds present, soft, non tender Musculoskeletal- restricted movement to left shoulder with her arm held in adducted position, + mild kyphosis, arthritis changes in her fingers, on wheelchair, left sided weakness> right side, good radial pulse Skin- warm and dry Psychiatry- pleasantly confused with poor insight  Labs CBC Latest Ref Rng 07/07/2015 02/04/2015 08/14/2014  WBC - 5.6 6.3 5.2  Hemoglobin 12.0 - 16.0 g/dL 11.5(A) 11.7(A) 11.9(A)  Hematocrit 36 -  46 % 39 38 38  Platelets 150 - 399 K/L 210 168 175   CMP Latest Ref Rng 07/07/2015 03/01/2015 01/22/2015  Glucose 70 - 99 mg/dL - - -  BUN 4 - 21 mg/dL 01/24/2015) 18(Z) 35(O)  Creatinine 0.5 - 1.1 mg/dL 0.9 0.9 1.0  Sodium 25(P - 147 mmol/L 141 142 140  Potassium 3.4 - 5.3 mmol/L 4.1 4.6 4.1  Chloride 96 - 112 mEq/L - - -  CO2 19 - 32 mEq/L - - -  Calcium 8.4 - 10.5 mg/dL - - -  Alkaline Phos 25 - 125 U/L  64 - 65  AST 13 - 35 U/L 13 - 13  ALT 7 - 35 U/L 8 - 8    Assessment/plan  Left humeral neck fracture Noted on xray. This is post fall. Has good radial pulse. Will need to keep her arm immobilized and has orthopedic appointment at 3 pm. Will f/u on recommendation. Give tylenol extra strength 500 mg 2 tab x 1 now  Chest wall tenderness Post fall with trauma. Chest xray result not available for review but per NP, no rib fracture reported. Monitor clinically. Will need pain management. Will give tylenol as above for now and f/u on orthopedic recommendation  Fall initial encounter She is a high fall risk with advanced dementia and unsteady gait. Fall precautions. Will get u/a with c/s to rule out uti and cbc with diff and cmp to rule out metabolic causes  Dementia with behavior disturbance Continue aricept for now with supportive care.     Oneal Grout, MD Internal Medicine [facility] [address] [place], [place] [address] Cell Phone (Monday-Friday 8 am - 5 pm): [phone] On Call: [phone] and follow prompts after 5 pm and on weekends Office Phone: [phone] Office Fax: [phone]

## 2015-08-26 LAB — BASIC METABOLIC PANEL
BUN: 26 mg/dL — AB (ref 4–21)
Creatinine: 1.2 mg/dL — AB (ref 0.5–1.1)
Glucose: 82 mg/dL
Potassium: 4.3 mmol/L (ref 3.4–5.3)
Sodium: 141 mmol/L (ref 137–147)

## 2015-08-26 LAB — HEPATIC FUNCTION PANEL
ALK PHOS: 61 U/L (ref 25–125)
ALT: 15 U/L (ref 7–35)
AST: 18 U/L (ref 13–35)
BILIRUBIN, TOTAL: 0.4 mg/dL

## 2015-08-26 LAB — CBC AND DIFFERENTIAL
HEMATOCRIT: 38 % (ref 36–46)
HEMOGLOBIN: 11.2 g/dL — AB (ref 12.0–16.0)
PLATELETS: 163 10*3/uL (ref 150–399)
WBC: 5.1 10^3/mL

## 2015-09-01 LAB — HEPATIC FUNCTION PANEL
ALK PHOS: 71 U/L (ref 25–125)
ALT: 15 U/L (ref 7–35)
AST: 16 U/L (ref 13–35)
BILIRUBIN, TOTAL: 0.4 mg/dL

## 2015-09-01 LAB — BASIC METABOLIC PANEL
BUN: 28 mg/dL — AB (ref 4–21)
Creatinine: 1.3 mg/dL — AB (ref 0.5–1.1)
Glucose: 93 mg/dL
POTASSIUM: 4.4 mmol/L (ref 3.4–5.3)
SODIUM: 139 mmol/L (ref 137–147)

## 2015-09-01 LAB — CBC AND DIFFERENTIAL
HEMATOCRIT: 39 % (ref 36–46)
HEMOGLOBIN: 11.9 g/dL — AB (ref 12.0–16.0)
Platelets: 221 10*3/uL (ref 150–399)
WBC: 5.9 10^3/mL

## 2015-09-09 ENCOUNTER — Other Ambulatory Visit: Payer: Self-pay | Admitting: *Deleted

## 2015-09-09 MED ORDER — LORAZEPAM 0.5 MG PO TABS: ORAL_TABLET | ORAL | Status: DC

## 2015-09-09 NOTE — Telephone Encounter (Signed)
[facility] 

## 2015-09-27 ENCOUNTER — Non-Acute Institutional Stay (SKILLED_NURSING_FACILITY): Payer: Medicare Other | Admitting: Internal Medicine

## 2015-09-27 ENCOUNTER — Encounter: Payer: Self-pay | Admitting: Internal Medicine

## 2015-09-27 DIAGNOSIS — F01518 Vascular dementia, unspecified severity, with other behavioral disturbance: Secondary | ICD-10-CM

## 2015-09-27 DIAGNOSIS — F0151 Vascular dementia with behavioral disturbance: Secondary | ICD-10-CM

## 2015-09-27 DIAGNOSIS — H04123 Dry eye syndrome of bilateral lacrimal glands: Secondary | ICD-10-CM

## 2015-09-27 DIAGNOSIS — F339 Major depressive disorder, recurrent, unspecified: Secondary | ICD-10-CM | POA: Diagnosis not present

## 2015-09-27 NOTE — Progress Notes (Signed)
Patient ID: Marciana Uplinger, female   DOB: [birth year], 82 y.o.   MRN: [id number]      [facility]  Code Status: DNR  Chief Complaint  Patient presents with  . Medical Management of Chronic Issues    Routine Visit    Allergies  Allergen Reactions  . Penicillins    Advanced Directives 08/25/2015  Does patient have an advance directive? Yes  Type of Advance Directive Out of facility DNR (pink MOST or yellow form)  Does patient want to make changes to advanced directive? No - Patient declined  Copy of advanced directive(s) in chart? Yes   Extended Emergency Contact Information Primary Emergency Contact: Reeves,Phillip Address: [address]          [place] [address] [place] Home Phone: [phone] Work Phone: [phone] Mobile Phone: [phone] Relation: Nephew Secondary Emergency Contact: Reeves,Clara Address: [address]          [place], [place] [address] [place] Home Phone: [phone] Mobile Phone: [phone] Relation: Niece    HPI 82 y/o female patient is seen for routine visit. She has advanced dementia. denies any concern this visit. No new concern from staff.     Review of Systems  Constitutional: Negative for fever HENT: Negative for congestion Eyes: Negative for blurred vision and discharge.  Respiratory: Negative for cough, shortness of breath and wheezing.   Cardiovascular: Negative for chest pain.  Gastrointestinal: Negative for heartburn, nausea, vomiting, abdominal pain Genitourinary: Negative for dysuria.  Musculoskeletal: Negative for back pain Skin: Negative for itching and rash.     Past Medical History  Diagnosis Date  . Dementia   . CVA (cerebral infarction)   . Hemiparesis (HCC)     right  . Dysphagia   . Osteoporosis   . Hyperlipemia   . Anemia   . Anxiety   . Depression   . Difficulty in walking(719.7)   . Muscle weakness (generalized)   . Personal history of  traumatic brain injury   . Personal history of fall   . Aftercare for healing traumatic fracture of hip        Medication List       This list is accurate as of: 09/27/15  3:44 PM.  Always use your most recent med list.               aspirin 81 MG tablet  Take 81 mg by mouth daily.     BIOFREEZE 4 % Gel  Generic drug:  Menthol (Topical Analgesic)  Apply 1 application topically 2 (two) times daily.     calcium-vitamin D 500-400 MG-UNIT tablet  Commonly known as:  OSCAL-500  Take 1 tablet by mouth 2 (two) times daily.     cholecalciferol 1000 units tablet  Commonly known as:  VITAMIN D  Take 1,000 Units by mouth daily.     divalproex 125 MG DR tablet  Commonly known as:  DEPAKOTE  125 mg in the am and 250 mg in the pm     donepezil 10 MG tablet  Commonly known as:  ARICEPT  Take 10 mg by mouth daily.     ferrous sulfate 325 (65 FE) MG tablet  Take 325 mg by mouth 2 (two) times daily with a meal.     HYDROcodone-acetaminophen 5-325 MG tablet  Commonly known as:  NORCO/VICODIN  Take 1 tablet by mouth every 6 (six) hours as needed for moderate pain.     ipratropium-albuterol  0.5-2.5 (3) MG/3ML Soln  Commonly known as:  DUONEB  Take 3 mLs by nebulization every 6 (six) hours as needed.     lidocaine 5 %  Commonly known as:  LIDODERM  Place 1 patch onto the skin daily. Remove & Discard patch within 12 hours or as directed by MD     LORazepam 0.5 MG tablet  Commonly known as:  ATIVAN  Take 1/2 tablet by mouth twice daily as needed for agitation/anxiety     polyvinyl alcohol 1.4 % ophthalmic solution  Commonly known as:  LIQUIFILM TEARS  1 drop 2 (two) times daily. Wait 3-5 minutes between 2 eye meds.     saccharomyces boulardii 250 MG capsule  Commonly known as:  FLORASTOR  Take 250 mg by mouth 2 (two) times daily. Stop date 09/27/15     senna 8.6 MG Tabs tablet  Commonly known as:  SENOKOT  Take 2 tablets by mouth daily.     sertraline 100 MG tablet    Commonly known as:  ZOLOFT  Take 100 mg by mouth daily.     UNABLE TO FIND  Med Name: Med pass 90 mL by mouth four times daily for nutritional support     UNABLE TO FIND  Med Name: Mineral oil. Administer 5 drops into each ear every night at bedtime       Immunization History  Administered Date(s) Administered  . Influenza-Unspecified 12/26/2013, 12/31/2014  . PPD Test 07/01/2012, 01/21/2014, 12/31/2014  . Pneumococcal-Unspecified 01/09/2014    Physical exam BP 124/72 mmHg  Pulse 86  Resp 20  Ht 5\' 5"  (1.651 m)  Wt 120 lb 6.4 oz (54.613 kg)  BMI 20.04 kg/m2  SpO2 93%  Wt Readings from Last 3 Encounters:  09/27/15 120 lb 6.4 oz (54.613 kg)  08/25/15 120 lb 6.4 oz (54.613 kg)  07/06/15 119 lb 3.2 oz (54.069 kg)   General- elderly thin built, frail, female in no acute distress Head- atraumatic, normocephalic Eyes- PERRLA, EOMI, no pallor, no icterus, no discharge Neck- no lymphadenopathy Cardiovascular- normal s1,s2, no murmurs, no leg edema Respiratory- bilateral clear to auscultation, no wheeze, no rhonchi, no crackles Abdomen- bowel sounds present, soft, non tender Musculoskeletal- restricted movement to left shoulder, + mild kyphosis, arthritis changes in her fingers, on wheelchair, left sided weakness> right side, good radial pulse Skin- warm and dry Psychiatry- pleasantly confused with poor insight  Labs CBC Latest Ref Rng 09/01/2015 08/26/2015 07/07/2015  WBC - 5.9 5.1 5.6  Hemoglobin 12.0 - 16.0 g/dL 11.9(A) 11.2(A) 11.5(A)  Hematocrit 36 - 46 % 39 38 39  Platelets 150 - 399 K/L 221 163 210   CMP Latest Ref Rng 09/01/2015 08/26/2015 07/07/2015  Glucose 70 - 99 mg/dL - - -  BUN 4 - 21 mg/dL 41(Y) 60(Y) 30(Z)  Creatinine 0.5 - 1.1 mg/dL 1.3(A) 1.2(A) 0.9  Sodium 137 - 147 mmol/L 139 141 141  Potassium 3.4 - 5.3 mmol/L 4.4 4.3 4.1  Chloride 96 - 112 mEq/L - - -  CO2 19 - 32 mEq/L - - -  Calcium 8.4 - 10.5 mg/dL - - -  Alkaline Phos 25 - 125 U/L 71 61 64  AST 13  - 35 U/L 16 18 13   ALT 7 - 35 U/L 15 15 8     Assessment/plan  Advanced vascular dementia Continue aricept, continue supportive care with pressure ulcer and fall prophylaxis. Continue ativan as needed for anxiety  Chronic depression Mood stable, continue zoloft 100 mg daily with depakote sprinkles  Dry eyes Continue artificial tear drop    Oneal Grout, MD Internal Medicine [facility] [address] [place], [place] [address] Cell Phone (Monday-Friday 8 am - 5 pm): [phone] On Call: [phone] and follow prompts after 5 pm and on weekends Office Phone: [phone] Office Fax: [phone]

## 2015-11-05 ENCOUNTER — Non-Acute Institutional Stay (SKILLED_NURSING_FACILITY): Payer: Medicare Other | Admitting: Internal Medicine

## 2015-11-05 ENCOUNTER — Encounter: Payer: Self-pay | Admitting: Internal Medicine

## 2015-11-05 DIAGNOSIS — E440 Moderate protein-calorie malnutrition: Secondary | ICD-10-CM

## 2015-11-05 DIAGNOSIS — F329 Major depressive disorder, single episode, unspecified: Secondary | ICD-10-CM | POA: Diagnosis not present

## 2015-11-05 DIAGNOSIS — I5032 Chronic diastolic (congestive) heart failure: Secondary | ICD-10-CM

## 2015-11-05 DIAGNOSIS — I959 Hypotension, unspecified: Secondary | ICD-10-CM | POA: Diagnosis not present

## 2015-11-05 NOTE — Progress Notes (Signed)
Patient ID: Mallory Herring, female   DOB: November 08, 1930, 82 y.o.   MRN: [id number]      [facility]  Code Status: DNR  Chief Complaint  Patient presents with  . Medical Management of Chronic Issues    Routine Visit    Allergies  Allergen Reactions  . Penicillins    Advanced Directives 08/25/2015  Does patient have an advance directive? Yes  Type of Advance Directive Out of facility DNR (pink MOST or yellow form)  Does patient want to make changes to advanced directive? No - Patient declined  Copy of advanced directive(s) in chart? Yes   Extended Emergency Contact Information Primary Emergency Contact: Reeves,Phillip Address: [address]          [place] [address] [place] Home Phone: [phone] Work Phone: [phone] Mobile Phone: [phone] Relation: Nephew Secondary Emergency Contact: Reeves,Clara Address: [address]          [place], [place] [address] [place] Home Phone: [phone] Mobile Phone: [phone] Relation: Niece    HPI 82 y/o female patient is seen for routine visit. She has advanced dementia. She is upset today about being in this facility. She would like to go home. nursing staff have noticed her to be agitated and anxious lately and be rude with the staff. No recent fall reported. No new skin concern. Limited participation in HPI and ROS. She has low bp reading on review of 98-105/56-69.   Review of Systems : limited with her dementia Constitutional: Negative for fever HENT: Negative for congestion Respiratory: Negative for cough, shortness of breath and wheezing.   Cardiovascular: Negative for chest pain.  Gastrointestinal: Negative for heartburn, nausea, vomiting, abdominal pain Genitourinary: Negative for dysuria.  Musculoskeletal: Negative for back pain Skin: Negative for itching and rash.     Past Medical History:  Diagnosis Date  . Aftercare for healing traumatic  fracture of hip   . Anemia   . Anxiety   . CVA (cerebral infarction)   . Dementia   . Depression   . Difficulty in walking(719.7)   . Dysphagia   . Hemiparesis (HCC)    right  . Hyperlipemia   . Muscle weakness (generalized)   . Osteoporosis   . Personal history of fall   . Personal history of traumatic brain injury        Medication List       Accurate as of 11/05/15 12:59 PM. Always use your most recent med list.          aspirin 81 MG tablet Take 81 mg by mouth daily.   BIOFREEZE 4 % Gel Generic drug:  Menthol (Topical Analgesic) Apply 1 application topically 2 (two) times daily.   calcium-vitamin D 500-400 MG-UNIT tablet Commonly known as:  OSCAL-500 Take 1 tablet by mouth 2 (two) times daily.   cholecalciferol 1000 units tablet Commonly known as:  VITAMIN D Take 1,000 Units by mouth daily.   divalproex 125 MG DR tablet Commonly known as:  DEPAKOTE 125 mg in the am and 250 mg in the pm   donepezil 10 MG tablet Commonly known as:  ARICEPT Take 10 mg by mouth daily.   ferrous sulfate 325 (65 FE) MG tablet Take 325 mg by mouth 2 (two) times daily with a meal.   HYDROcodone-acetaminophen 5-325 MG tablet Commonly known as:  NORCO/VICODIN Take 1 tablet by mouth every 6 (six) hours as needed for moderate pain.   ipratropium-albuterol 0.5-2.5 (3)  MG/3ML Soln Commonly known as:  DUONEB Take 3 mLs by nebulization every 6 (six) hours as needed.   lidocaine 5 % Commonly known as:  LIDODERM Place 1 patch onto the skin daily. Remove & Discard patch within 12 hours or as directed by MD   LORazepam 0.5 MG tablet Commonly known as:  ATIVAN Take 1/2 tablet by mouth twice daily as needed for agitation/anxiety   polyvinyl alcohol 1.4 % ophthalmic solution Commonly known as:  LIQUIFILM TEARS 1 drop 2 (two) times daily. Wait 3-5 minutes between 2 eye meds.   senna 8.6 MG Tabs tablet Commonly known as:  SENOKOT Take 2 tablets by mouth daily.   sertraline 100  MG tablet Commonly known as:  ZOLOFT Take 100 mg by mouth daily.   UNABLE TO FIND Med Name: Med pass 90 mL by mouth four times daily for nutritional support   UNABLE TO FIND Med Name: Mineral oil. Administer 5 drops into each ear every night at bedtime      Immunization History  Administered Date(s) Administered  . Influenza-Unspecified 12/26/2013, 12/31/2014  . PPD Test 07/01/2012, 01/21/2014, 12/31/2014  . Pneumococcal-Unspecified 01/09/2014    Physical exam BP (!) 98/56   Pulse 75   Temp (!) 96.9 F (36.1 C) (Oral)   Resp 18   Ht 5\' 5"  (1.651 m)   Wt 117 lb 12.8 oz (53.4 kg)   SpO2 94%   BMI 19.60 kg/m   Wt Readings from Last 3 Encounters:  11/05/15 117 lb 12.8 oz (53.4 kg)  09/27/15 120 lb 6.4 oz (54.6 kg)  08/25/15 120 lb 6.4 oz (54.6 kg)   General- elderly thin built, frail, female in no acute distress Head- atraumatic, normocephalic Eyes- no pallor, no icterus, no discharge Neck- no lymphadenopathy Cardiovascular- normal s1,s2, no murmur, no leg edema Respiratory- bilateral clear to auscultation, no wheeze, no rhonchi, no crackles Abdomen- bowel sounds present, soft, non tender Musculoskeletal- restricted movement to left shoulder, + mild kyphosis, arthritis changes in her fingers, on wheelchair, left sided weakness> right side, good radial pulse Skin- warm and dry Psychiatry- pleasantly confused with poor insight  Labs CBC Latest Ref Rng & Units 09/01/2015 08/26/2015 07/07/2015  WBC 10:3/mL 5.9 5.1 5.6  Hemoglobin 12.0 - 16.0 g/dL 11.9(A) 11.2(A) 11.5(A)  Hematocrit 36 - 46 % 39 38 39  Platelets 150 - 399 K/L 221 163 210   CMP Latest Ref Rng & Units 09/01/2015 08/26/2015 07/07/2015  Glucose 70 - 99 mg/dL - - -  BUN 4 - 21 mg/dL 09/06/2015) 82(U) 23(N)  Creatinine 0.5 - 1.1 mg/dL 1.3(A) 1.2(A) 0.9  Sodium 137 - 147 mmol/L 139 141 141  Potassium 3.4 - 5.3 mmol/L 4.4 4.3 4.1  Chloride 96 - 112 mEq/L - - -  CO2 19 - 32 mEq/L - - -  Calcium 8.4 - 10.5 mg/dL - - -    Alkaline Phos 25 - 125 U/L 71 61 64  AST 13 - 35 U/L 16 18 13   ALT 7 - 35 U/L 15 15 8     Assessment/plan  Depression with anxiety Frequent outbursts per nursing. Currently on zoloft 100 mg daily with ativan 0.25 mg bid prn and depakote 125 mg in am and 250 mg in pm. Change her depakote to 250 mg bid and monitor.   Hypotension Asymptomatic. Monitor bp daily for now. Denies any symptom. Hydration to be maintained. Concern for autoregulation dysfunction with history of CVA. Another possibility is iatrogenic with her on lidoderm and norco. Check  am cortisol level  Protein calorie malnutrition Frail. Decline anticipated with her advanced dementia. Monitor clinically. Continue medpass supplement  Chronic diastolic chf Stable, no signs of fluid overload. Continue baby aspirin. Off all other meds. Monitor for signs/ symptoms of chf exacerbation    Oneal Grout, MD Internal Medicine [facility] [address] [place], [place] [address] Cell Phone (Monday-Friday 8 am - 5 pm): [phone] On Call: [phone] and follow prompts after 5 pm and on weekends Office Phone: [phone] Office Fax: [phone]

## 2015-12-16 ENCOUNTER — Encounter: Payer: Self-pay | Admitting: Internal Medicine

## 2015-12-16 ENCOUNTER — Non-Acute Institutional Stay (SKILLED_NURSING_FACILITY): Payer: Medicare Other | Admitting: Internal Medicine

## 2015-12-16 DIAGNOSIS — M818 Other osteoporosis without current pathological fracture: Secondary | ICD-10-CM | POA: Diagnosis not present

## 2015-12-16 DIAGNOSIS — M810 Age-related osteoporosis without current pathological fracture: Secondary | ICD-10-CM | POA: Insufficient documentation

## 2015-12-16 DIAGNOSIS — I739 Peripheral vascular disease, unspecified: Secondary | ICD-10-CM | POA: Diagnosis not present

## 2015-12-16 DIAGNOSIS — M150 Primary generalized (osteo)arthritis: Secondary | ICD-10-CM

## 2015-12-16 NOTE — Progress Notes (Signed)
Patient ID: Mallory Herring, female   DOB: [birth year], 82 y.o.   MRN: [id number]      [facility]  Code Status: DNR  Chief Complaint  Patient presents with  . Medical Management of Chronic Issues    Routine Visit    Allergies  Allergen Reactions  . Penicillins    Advanced Directives 08/25/2015  Does patient have an advance directive? Yes  Type of Advance Directive Out of facility DNR (pink MOST or yellow form)  Does patient want to make changes to advanced directive? No - Patient declined  Copy of advanced directive(s) in chart? Yes   Extended Emergency Contact Information Primary Emergency Contact: Reeves,Phillip Address: [address]          [place] [address] [place] Home Phone: [phone] Work Phone: [phone] Mobile Phone: [phone] Relation: Nephew Secondary Emergency Contact: Reeves,Clara Address: [address]          [place], [place] [address] [place] Home Phone: [phone] Mobile Phone: [phone] Relation: Niece    HPI 82 y/o female patient is seen for routine visit. She has advanced dementia. Denies any concern this visit. She had skin tear to her left leg that has now healed.   Review of Systems : limited with her dementia Constitutional: Negative for fever Respiratory: Negative for cough, shortness of breath Cardiovascular: Negative for chest pain.  Gastrointestinal: Negative for heartburn, nausea, vomiting, abdominal pain Genitourinary: Negative for dysuria.  Musculoskeletal: Negative for back pain Skin: Negative for itching and rash.     Past Medical History:  Diagnosis Date  . Aftercare for healing traumatic fracture of hip   . Anemia   . Anxiety   . CVA (cerebral infarction)   . Dementia   . Depression   . Difficulty in walking(719.7)   . Dysphagia   . Hemiparesis (HCC)    right  . Hyperlipemia   . Muscle weakness (generalized)   . Osteoporosis   . Personal  history of fall   . Personal history of traumatic brain injury        Medication List       Accurate as of 12/16/15  1:01 PM. Always use your most recent med list.          aspirin 81 MG tablet Take 81 mg by mouth daily.   BIOFREEZE 4 % Gel Generic drug:  Menthol (Topical Analgesic) Apply 1 application topically 2 (two) times daily.   calcium-vitamin D 500-400 MG-UNIT tablet Commonly known as:  OSCAL-500 Take 1 tablet by mouth 2 (two) times daily.   cholecalciferol 1000 units tablet Commonly known as:  VITAMIN D Take 1,000 Units by mouth daily.   divalproex 125 MG DR tablet Commonly known as:  DEPAKOTE 125 mg in the am and 250 mg in the pm   donepezil 10 MG tablet Commonly known as:  ARICEPT Take 10 mg by mouth daily.   ferrous sulfate 325 (65 FE) MG tablet Take 325 mg by mouth 2 (two) times daily with a meal.   HYDROcodone-acetaminophen 5-325 MG tablet Commonly known as:  NORCO/VICODIN Take 1 tablet by mouth every 8 (eight) hours as needed for moderate pain.   ipratropium-albuterol 0.5-2.5 (3) MG/3ML Soln Commonly known as:  DUONEB Take 3 mLs by nebulization every 6 (six) hours as needed.   lidocaine 5 % Commonly known as:  LIDODERM Place 1 patch onto the skin daily. Remove & Discard patch within 12 hours or as directed  by MD   LORazepam 0.5 MG tablet Commonly known as:  ATIVAN Take 1/2 tablet by mouth twice daily as needed for agitation/anxiety   polyvinyl alcohol 1.4 % ophthalmic solution Commonly known as:  LIQUIFILM TEARS 1 drop 2 (two) times daily. Wait 3-5 minutes between 2 eye meds.   senna 8.6 MG Tabs tablet Commonly known as:  SENOKOT Take 2 tablets by mouth daily.   sertraline 100 MG tablet Commonly known as:  ZOLOFT Take 100 mg by mouth daily.   UNABLE TO FIND Med Name: Med pass 90 mL by mouth four times daily for nutritional support   UNABLE TO FIND Med Name: Mineral oil. Administer 5 drops into each ear every night at bedtime        Immunization History  Administered Date(s) Administered  . Influenza-Unspecified 12/26/2013, 12/31/2014  . PPD Test 07/01/2012, 01/21/2014, 12/31/2014  . Pneumococcal-Unspecified 01/09/2014    Physical exam BP 120/80   Pulse 77   Temp 97.5 F (36.4 C) (Oral)   Resp 20   Ht 5\' 5"  (1.651 m)   Wt 117 lb 12.8 oz (53.4 kg)   SpO2 96%   BMI 19.60 kg/m   Wt Readings from Last 3 Encounters:  12/16/15 117 lb 12.8 oz (53.4 kg)  11/05/15 117 lb 12.8 oz (53.4 kg)  09/27/15 120 lb 6.4 oz (54.6 kg)   General- elderly thin built, frail, female in no acute distress Head- atraumatic, normocephalic Eyes- no pallor, no icterus Neck- no lymphadenopathy Cardiovascular- normal s1,s2, no murmur, no leg edema Respiratory- bilateral clear to auscultation, no wheeze, no rhonchi, no crackles Abdomen- bowel sounds present, soft, non tender Musculoskeletal- restricted movement to left shoulder, + mild kyphosis, arthritis changes in her fingers, on wheelchair, left sided weakness> right side, good radial pulse Skin- warm and dry Psychiatry- pleasantly confused with poor insight  Labs CBC Latest Ref Rng & Units 09/01/2015 08/26/2015 07/07/2015  WBC 10:3/mL 5.9 5.1 5.6  Hemoglobin 12.0 - 16.0 g/dL 11.9(A) 11.2(A) 11.5(A)  Hematocrit 36 - 46 % 39 38 39  Platelets 150 - 399 K/L 221 163 210   CMP Latest Ref Rng & Units 09/01/2015 08/26/2015 07/07/2015  Glucose 70 - 99 mg/dL - - -  BUN 4 - 21 mg/dL 09/06/2015) 90(X) 83(F)  Creatinine 0.5 - 1.1 mg/dL 1.3(A) 1.2(A) 0.9  Sodium 137 - 147 mmol/L 139 141 141  Potassium 3.4 - 5.3 mmol/L 4.4 4.3 4.1  Chloride 96 - 112 mEq/L - - -  CO2 19 - 32 mEq/L - - -  Calcium 8.4 - 10.5 mg/dL - - -  Alkaline Phos 25 - 125 U/L 71 61 64  AST 13 - 35 U/L 16 18 13   ALT 7 - 35 U/L 15 15 8     Assessment/plan  PVD Had recent skin tear and this is now resolving with scab formation. To provide skin care. Continue baby aspirin and monitor for open sores/ wound  Generalized  OA Stable, continue lidoderm patch and biofreexze, continue vit d  Age related osteoporosis High fall risk. Continue her vitamin d supplement and fall precautions   38(V, MD Internal Medicine [facility] [address] [place], [facility] [place] Cell Phone (Monday-Friday 8 am - 5 pm): [phone] On Call: [phone] and follow prompts after 5 pm and on weekends Office Phone: [phone] Office Fax: [phone]

## 2016-01-02 ENCOUNTER — Emergency Department (HOSPITAL_COMMUNITY): Payer: Medicare Other

## 2016-01-02 ENCOUNTER — Encounter (HOSPITAL_COMMUNITY): Payer: Self-pay | Admitting: Nurse Practitioner

## 2016-01-02 ENCOUNTER — Emergency Department (HOSPITAL_COMMUNITY)
Admission: EM | Admit: 2016-01-02 | Discharge: 2016-01-02 | Disposition: A | Discharge status: Home / Self Care | Payer: Medicare Other | Attending: Emergency Medicine | Admitting: Emergency Medicine

## 2016-01-02 DIAGNOSIS — R1033 Periumbilical pain: Secondary | ICD-10-CM | POA: Diagnosis not present

## 2016-01-02 DIAGNOSIS — R1013 Epigastric pain: Secondary | ICD-10-CM | POA: Diagnosis present

## 2016-01-02 DIAGNOSIS — R1010 Upper abdominal pain, unspecified: Secondary | ICD-10-CM

## 2016-01-02 DIAGNOSIS — Z7982 Long term (current) use of aspirin: Secondary | ICD-10-CM | POA: Diagnosis not present

## 2016-01-02 DIAGNOSIS — Z79899 Other long term (current) drug therapy: Secondary | ICD-10-CM | POA: Insufficient documentation

## 2016-01-02 DIAGNOSIS — I714 Abdominal aortic aneurysm, without rupture: Secondary | ICD-10-CM | POA: Diagnosis not present

## 2016-01-02 LAB — CBC WITH DIFFERENTIAL/PLATELET
BASOS ABS: 0 10*3/uL (ref 0.0–0.1)
Basophils Relative: 1 %
EOS ABS: 0.1 10*3/uL (ref 0.0–0.7)
EOS PCT: 2 %
HCT: 38.8 % (ref 36.0–46.0)
Hemoglobin: 11.9 g/dL — ABNORMAL LOW (ref 12.0–15.0)
LYMPHS ABS: 0.7 10*3/uL (ref 0.7–4.0)
Lymphocytes Relative: 13 %
MCH: 27.8 pg (ref 26.0–34.0)
MCHC: 30.7 g/dL (ref 30.0–36.0)
MCV: 90.7 fL (ref 78.0–100.0)
MONO ABS: 0.6 10*3/uL (ref 0.1–1.0)
Monocytes Relative: 11 %
Neutro Abs: 4.2 10*3/uL (ref 1.7–7.7)
Neutrophils Relative %: 73 %
PLATELETS: 145 10*3/uL — AB (ref 150–400)
RBC: 4.28 MIL/uL (ref 3.87–5.11)
RDW: 16.4 % — AB (ref 11.5–15.5)
WBC: 5.7 10*3/uL (ref 4.0–10.5)

## 2016-01-02 LAB — URINALYSIS, ROUTINE W REFLEX MICROSCOPIC
BILIRUBIN URINE: NEGATIVE
Glucose, UA: NEGATIVE mg/dL
Hgb urine dipstick: NEGATIVE
KETONES UR: NEGATIVE mg/dL
NITRITE: POSITIVE — AB
PH: 7.5 (ref 5.0–8.0)
PROTEIN: NEGATIVE mg/dL
SPECIFIC GRAVITY, URINE: 1.013 (ref 1.005–1.030)

## 2016-01-02 LAB — URINE MICROSCOPIC-ADD ON: RBC / HPF: NONE SEEN RBC/hpf (ref 0–5)

## 2016-01-02 LAB — COMPREHENSIVE METABOLIC PANEL
ALT: 12 U/L — AB (ref 14–54)
AST: 19 U/L (ref 15–41)
Albumin: 3.3 g/dL — ABNORMAL LOW (ref 3.5–5.0)
Alkaline Phosphatase: 53 U/L (ref 38–126)
Anion gap: 7 (ref 5–15)
BUN: 30 mg/dL — AB (ref 6–20)
CALCIUM: 8.9 mg/dL (ref 8.9–10.3)
CO2: 29 mmol/L (ref 22–32)
CREATININE: 1.1 mg/dL — AB (ref 0.44–1.00)
Chloride: 103 mmol/L (ref 101–111)
GFR, EST AFRICAN AMERICAN: 52 mL/min — AB (ref >60)
GFR, EST NON AFRICAN AMERICAN: 45 mL/min — AB (ref >60)
Glucose, Bld: 87 mg/dL (ref 65–99)
Potassium: 4.2 mmol/L (ref 3.5–5.1)
SODIUM: 139 mmol/L (ref 135–145)
TOTAL PROTEIN: 6.8 g/dL (ref 6.5–8.1)
Total Bilirubin: 0.5 mg/dL (ref 0.3–1.2)

## 2016-01-02 LAB — I-STAT CHEM 8, ED
BUN: 29 mg/dL — AB (ref 6–20)
CALCIUM ION: 1.18 mmol/L (ref 1.15–1.40)
CHLORIDE: 99 mmol/L — AB (ref 101–111)
Creatinine, Ser: 1.1 mg/dL — ABNORMAL HIGH (ref 0.44–1.00)
Glucose, Bld: 85 mg/dL (ref 65–99)
HCT: 38 % (ref 36.0–46.0)
Hemoglobin: 12.9 g/dL (ref 12.0–15.0)
POTASSIUM: 4.1 mmol/L (ref 3.5–5.1)
SODIUM: 140 mmol/L (ref 135–145)
TCO2: 29 mmol/L (ref 0–100)

## 2016-01-02 MED ORDER — SODIUM CHLORIDE 0.9 % IV BOLUS (SEPSIS)
500.0000 mL | Freq: Once | INTRAVENOUS | Status: AC
  Administered 2016-01-02: 500 mL via INTRAVENOUS

## 2016-01-02 MED ORDER — IOPAMIDOL (ISOVUE-300) INJECTION 61%
15.0000 mL | Freq: Once | INTRAVENOUS | Status: AC | PRN
  Administered 2016-01-02: 15 mL via ORAL

## 2016-01-02 MED ORDER — IOPAMIDOL (ISOVUE-300) INJECTION 61%
100.0000 mL | Freq: Once | INTRAVENOUS | Status: AC | PRN
  Administered 2016-01-02: 80 mL via INTRAVENOUS

## 2016-01-02 NOTE — ED Notes (Signed)
RN will start IV and collect labs 

## 2016-01-02 NOTE — ED Provider Notes (Signed)
[facility]-EMERGENCY DEPT Provider Note   CSN: [id number] Arrival date & time: 01/02/16  1012     History   Chief Complaint Chief Complaint  Patient presents with  . Abdominal Pain    right side    HPI Venia Riveron is a 82 y.o. female.  Patient complains of epigastric abdominal pain.   The history is provided by the patient. No language interpreter was used.  Abdominal Pain   This is a new problem. The problem occurs constantly. The problem has not changed since onset.Associated with: Movement. The pain is located in the epigastric region. The pain is at a severity of 5/10. The pain is moderate. Pertinent negatives include anorexia, diarrhea, frequency, hematuria and headaches. Nothing aggravates the symptoms. Nothing relieves the symptoms. Past workup does not include GI consult. Her past medical history does not include PUD.    Past Medical History:  Diagnosis Date  . Aftercare for healing traumatic fracture of hip   . Anemia   . Anxiety   . CVA (cerebral infarction)   . Dementia   . Depression   . Difficulty in walking(719.7)   . Dysphagia   . Hemiparesis (HCC)    right  . Hyperlipemia   . Muscle weakness (generalized)   . Osteoporosis   . Personal history of fall   . Personal history of traumatic brain injury     Patient Active Problem List   Diagnosis Date Noted  . PVD (peripheral vascular disease) (HCC) 12/16/2015  . Age related osteoporosis 12/16/2015  . Adjustment disorder with mixed anxiety and depressed mood 07/06/2015  . Insufficiency of lacrimal sac 07/06/2015  . Rotator cuff tear 07/06/2015  . Osteoporosis of disuse 07/06/2015  . Annual physical exam 07/06/2015  . Hemiparesis affecting left side as late effect of stroke (HCC) 07/06/2015  . Hemiparesis affecting left side as late effect of cerebrovascular accident (HCC) 06/04/2015  . Major depression, chronic 04/05/2015  . Protein-calorie malnutrition, moderate (HCC) 04/05/2015  . Protein-calorie  malnutrition (HCC) 12/06/2014  . Vascular dementia with behavior disturbance 12/06/2014  . Congestive heart disease (HCC) 12/06/2014  . Primary generalized (osteo)arthritis 10/05/2014  . Vitamin D deficiency 08/13/2014  . Depression due to dementia 08/13/2014  . Gastroesophageal reflux disease without esophagitis 07/15/2014  . Hemiplga following cerebral infrc affecting left nondom side (HCC) 07/15/2014  . Anemia, iron deficiency 06/24/2014  . Degenerative joint disease of shoulder region 06/24/2014  . Slow transit constipation 06/24/2014  . Depression with anxiety 04/02/2014  . Vascular dementia 04/02/2014  . CVA, old, cognitive deficits 04/02/2014  . CVA, old, hemiparesis (HCC) 04/02/2014  . Thyroid activity decreased 03/02/2014  . Anemia of chronic disease 01/09/2014  . Senile dementia, uncomplicated 07/31/2013  . Dementia 04/26/2013  . Hyperlipidemia 04/26/2013  . Anxiety 01/17/2013  . Right hemiparesis (HCC) 01/17/2013  . Anemia of other chronic disease 11/15/2012  . Long term (current) use of anticoagulants 07/03/2012  . Generalized anxiety disorder 07/03/2012  . Other and unspecified hyperlipidemia 07/03/2012  . Dementia with behavioral disturbance 07/03/2012  . History of CVA (cerebrovascular accident) 07/03/2012  . Depression 07/03/2012  . Unspecified vitamin D deficiency 07/03/2012  . Hip fracture, right S/P hemiarthroplasty 07/03/2012    History reviewed. No pertinent surgical history.  OB History    No data available       Home Medications    Prior to Admission medications   Medication Sig Start Date End Date Taking? Authorizing Provider  aspirin 81 MG tablet Take 81 mg by mouth daily.  Yes Historical Provider, MD  calcium-vitamin D (OSCAL-500) 500-400 MG-UNIT per tablet Take 1 tablet by mouth 2 (two) times daily.   Yes Historical Provider, MD  cholecalciferol (VITAMIN D) 1000 UNITS tablet Take 1,000 Units by mouth daily.   Yes Historical Provider, MD    divalproex (DEPAKOTE) 125 MG DR tablet Take 250 mg by mouth 2 (two) times daily.    Yes Historical Provider, MD  donepezil (ARICEPT) 10 MG tablet Take 10 mg by mouth daily.    Yes Historical Provider, MD  HYDROcodone-acetaminophen (NORCO/VICODIN) 5-325 MG tablet Take 1 tablet by mouth every 8 (eight) hours as needed for moderate pain.    Yes Historical Provider, MD  ipratropium-albuterol (DUONEB) 0.5-2.5 (3) MG/3ML SOLN Take 3 mLs by nebulization every 6 (six) hours as needed.   Yes Historical Provider, MD  lidocaine (LIDODERM) 5 % Place 1 patch onto the skin daily. Remove & Discard patch within 12 hours or as directed by MD   Yes Historical Provider, MD  LORazepam (ATIVAN) 0.5 MG tablet Take 1/2 tablet by mouth twice daily as needed for agitation/anxiety 09/09/15  Yes Tiffany L Reed, DO  Menthol, Topical Analgesic, (BIOFREEZE) 4 % GEL Apply 1 application topically 2 (two) times daily.   Yes Historical Provider, MD  polyvinyl alcohol (LIQUIFILM TEARS) 1.4 % ophthalmic solution 1 drop 2 (two) times daily. Wait 3-5 minutes between 2 eye meds.   Yes Historical Provider, MD  senna (SENOKOT) 8.6 MG TABS Take 2 tablets by mouth daily.    Yes Historical Provider, MD  sertraline (ZOLOFT) 100 MG tablet Take 100 mg by mouth daily.   Yes Historical Provider, MD  UNABLE TO FIND Med Name: Med pass 90 mL by mouth four times daily for nutritional support   Yes Historical Provider, MD  UNABLE TO FIND Med Name: Mineral oil. Administer 5 drops into each ear every night at bedtime   Yes Historical Provider, MD    Family History History reviewed. No pertinent family history.  Social History Social History  Substance Use Topics  . Smoking status: Unknown If Ever Smoked  . Smokeless tobacco: Never Used  . Alcohol use No     Allergies   Penicillins   Review of Systems Review of Systems  Constitutional: Negative for appetite change and fatigue.  HENT: Negative for congestion, ear discharge and sinus  pressure.   Eyes: Negative for discharge.  Respiratory: Negative for cough.   Cardiovascular: Negative for chest pain.  Gastrointestinal: Positive for abdominal pain. Negative for anorexia and diarrhea.  Genitourinary: Negative for frequency and hematuria.  Musculoskeletal: Negative for back pain.  Skin: Negative for rash.  Neurological: Negative for seizures and headaches.  Psychiatric/Behavioral: Negative for hallucinations.     Physical Exam Updated Vital Signs BP 146/87 (BP Location: Right Arm)   Pulse 70   Temp 98.3 F (36.8 C) (Oral)   Resp 22   SpO2 90%   Physical Exam  Constitutional: She is oriented to person, place, and time. She appears well-developed.  HENT:  Head: Normocephalic.  Eyes: Conjunctivae and EOM are normal. No scleral icterus.  Neck: Neck supple. No thyromegaly present.  Cardiovascular: Normal rate and regular rhythm.  Exam reveals no gallop and no friction rub.   No murmur heard. Pulmonary/Chest: No stridor. She has no wheezes. She has no rales. She exhibits no tenderness.  Abdominal: She exhibits no distension. There is tenderness. There is no rebound.  Moderate tenderness epigastric area and periumbilical  Musculoskeletal: Normal range of motion. She  exhibits no edema.  Lymphadenopathy:    She has no cervical adenopathy.  Neurological: She is oriented to person, place, and time. She exhibits normal muscle tone. Coordination normal.  Skin: No rash noted. No erythema.  Psychiatric: She has a normal mood and affect. Her behavior is normal.     ED Treatments / Results  Labs (all labs ordered are listed, but only abnormal results are displayed) Labs Reviewed  CBC WITH DIFFERENTIAL/PLATELET - Abnormal; Notable for the following:       Result Value   Hemoglobin 11.9 (*)    RDW 16.4 (*)    Platelets 145 (*)    All other components within normal limits  COMPREHENSIVE METABOLIC PANEL - Abnormal; Notable for the following:    BUN 30 (*)     Creatinine, Ser 1.10 (*)    Albumin 3.3 (*)    ALT 12 (*)    GFR calc non Af Amer 45 (*)    GFR calc Af Amer 52 (*)    All other components within normal limits  URINALYSIS, ROUTINE W REFLEX MICROSCOPIC (NOT AT [facility]) - Abnormal; Notable for the following:    APPearance CLOUDY (*)    Nitrite POSITIVE (*)    Leukocytes, UA MODERATE (*)    All other components within normal limits  URINE MICROSCOPIC-ADD ON - Abnormal; Notable for the following:    Squamous Epithelial / LPF 0-5 (*)    Bacteria, UA MANY (*)    All other components within normal limits  I-STAT CHEM 8, ED - Abnormal; Notable for the following:    Chloride 99 (*)    BUN 29 (*)    Creatinine, Ser 1.10 (*)    All other components within normal limits    EKG  EKG Interpretation None       Radiology Ct Abdomen Pelvis W Contrast  Result Date: 01/02/2016 CLINICAL DATA:  complaining to nursing staff at her home of right upper and right lower quadrant pain. Staff reports that it may have been related to turning her but she continued to complain of pain and was sent out for eval. EXAM: CT ABDOMEN AND PELVIS WITH CONTRAST TECHNIQUE: Multidetector CT imaging of the abdomen and pelvis was performed using the standard protocol following bolus administration of intravenous contrast. CONTRAST:  58mL ISOVUE-300 IOPAMIDOL (ISOVUE-300) INJECTION 61%, 69mL ISOVUE-300 IOPAMIDOL (ISOVUE-300) INJECTION 61% COMPARISON:  None. FINDINGS: Lower chest: Coarse lung base reticular opacities which are likely combination scarring and atelectasis. No evidence of pneumonia or pulmonary edema. Lung base emphysema. Heart is normal in size. Small hiatal hernia. Hepatobiliary: Tiny low-density lesion in the central liver, likely a cyst. No other liver lesions. Liver normal in size and attenuation. Gallbladder is unremarkable. No bile duct dilation. Pancreas: Mild atrophy.  No mass or inflammation.  No duct dilation. Spleen: Prominent to mildly enlarged measuring  13.3 cm in greatest dimension. No mass or focal lesion. Adrenals/Urinary Tract: No adrenal masses. Bilateral renal cortical thinning. No renal masses or stones. No hydronephrosis. Ureters normal course and caliber. Bladder is not well seen due to bilateral hip prosthesis artifact. Stomach/Bowel: Stomach unremarkable other than the hiatal hernia. Small bowel is normal in caliber. No wall thickening or inflammation. There are multiple colonic diverticula mostly along the sigmoid colon. No diverticulitis or other bowel inflammation. Normal appendix visualized. Vascular/Lymphatic: There is a large abdominal aortic aneurysm beginning at the level of the renal arteries. No aneurysm has a maximum right to left diameter, measured on the coronal view, of  8.5 cm. Aneurysm has a maximum anterior posterior dimension of 6.3 cm measured on the sagittal reconstructed view. There is significant mural thrombus mostly along the left aspect of the mid to lower aneurysm. Atherosclerotic calcifications are noted throughout the aorta extending into the iliac vessels. There is no evidence of rupture of the aneurysm. No adenopathy. Reproductive: Pelvic structures obscured by artifact from the hip prostheses. Uterus appears to be surgically absent. No pelvic masses. Other: No abdominal wall hernia.  No ascites. Musculoskeletal: Bilateral hip prostheses appear well seated and aligned. Bones are diffusely demineralized. There are compression fractures of L1-L4, L1 treated with vertebroplasty. No osteoblastic or osteolytic lesions. IMPRESSION: 1. Large, infrarenal, abdominal aortic aneurysm, measuring 8.5 x 6.3 cm, with significant mural thrombus. No evidence of aneurysm rupture/leakage. 2. No acute findings within the abdomen pelvis. 3. No bowel obstruction or inflammation. Normal appendix visualized. Electronically Signed   By: Amie Portland M.D.   On: 01/02/2016 12:25    Procedures Procedures (including critical care  time)  Medications Ordered in ED Medications  sodium chloride 0.9 % bolus 500 mL (0 mLs Intravenous Stopped 01/02/16 1141)  iopamidol (ISOVUE-300) 61 % injection 15 mL (15 mLs Oral Contrast Given 01/02/16 1127)  iopamidol (ISOVUE-300) 61 % injection 100 mL (80 mLs Intravenous Contrast Given 01/02/16 1201)     Initial Impression / Assessment and Plan / ED Course  I have reviewed the triage vital signs and the nursing notes.  Pertinent labs & imaging results that were available during my care of the patient were reviewed by me and considered in my medical decision making (see chart for details).  Clinical Course    CT scan shows patient to have a AAA that's very large. I spoke with Dr. Darrick Penna the vascular surgeon who came and spoke with the family. It was decided to treat the patient conservatively and not do surgery.  Final Clinical Impressions(s) / ED Diagnoses   Final diagnoses:  Pain of upper abdomen    New Prescriptions New Prescriptions   No medications on file     Bethann Berkshire, MD 01/02/16 1500

## 2016-01-02 NOTE — ED Triage Notes (Signed)
Pt presents to [facility]ED via [facility] after complaining to nursing staff at her home, [facility], of right upper and right lower quadrant pain. Staff reports that it may have been related to turning her but she continued to complain of pain and was sent out for eval.

## 2016-01-02 NOTE — Consult Note (Signed)
Referring Physician: Dr Daylene [facility]  Patient name: Mallory Herring MRN: [id number] DOB: 04/17/1930 Sex: female  REASON FOR CONSULT: AAA  HPI: Mallory Herring is a 82 y.o. female, sent to the ER from [facility] today with abdominal pain.  She was found on CT to have 8 cm AAA.  This was not ruptured.  Her abdominal pain is now resolved.  The patient has severe dementia and only knows her name.  She is wheelchair bound secondary to a prior stroke which has left her with severe gait instability and high risk for falls.  She has had several ground level falls in the last 6 months and each event has had a prolonged recovery.   Other medical problems include right hemiparesis from stroke.  Her primary family decision maker, a nephew is at the bedside and provided some history  Past Medical History:  Diagnosis Date  . Aftercare for healing traumatic fracture of hip   . Anemia   . Anxiety   . CVA (cerebral infarction)   . Dementia   . Depression   . Difficulty in walking(719.7)   . Dysphagia   . Hemiparesis (HCC)    right  . Hyperlipemia   . Muscle weakness (generalized)   . Osteoporosis   . Personal history of fall   . Personal history of traumatic brain injury    History reviewed. No pertinent surgical history.  History reviewed. No pertinent family history.  SOCIAL HISTORY: Social History   Social History  . Marital status: Unknown    Spouse name: N/A  . Number of children: N/A  . Years of education: N/A   Occupational History  . Not on file.   Social History Main Topics  . Smoking status: Unknown If Ever Smoked  . Smokeless tobacco: Never Used  . Alcohol use No  . Drug use: No  . Sexual activity: Not Currently    Birth control/ protection: None   Other Topics Concern  . Not on file   Social History Narrative   ** Merged History Encounter **        Allergies  Allergen Reactions  . Penicillins     Noted on MAR    No current facility-administered  medications for this encounter.    Current Outpatient Prescriptions  Medication Sig Dispense Refill  . aspirin 81 MG tablet Take 81 mg by mouth daily.    . calcium-vitamin D (OSCAL-500) 500-400 MG-UNIT per tablet Take 1 tablet by mouth 2 (two) times daily.    . cholecalciferol (VITAMIN D) 1000 UNITS tablet Take 1,000 Units by mouth daily.    . divalproex (DEPAKOTE) 125 MG DR tablet Take 250 mg by mouth 2 (two) times daily.     Marland Kitchen donepezil (ARICEPT) 10 MG tablet Take 10 mg by mouth daily.     Marland Kitchen HYDROcodone-acetaminophen (NORCO/VICODIN) 5-325 MG tablet Take 1 tablet by mouth every 8 (eight) hours as needed for moderate pain.     Marland Kitchen ipratropium-albuterol (DUONEB) 0.5-2.5 (3) MG/3ML SOLN Take 3 mLs by nebulization every 6 (six) hours as needed.    . lidocaine (LIDODERM) 5 % Place 1 patch onto the skin daily. Remove & Discard patch within 12 hours or as directed by MD    . LORazepam (ATIVAN) 0.5 MG tablet Take 1/2 tablet by mouth twice daily as needed for agitation/anxiety 30 tablet 5  . Menthol, Topical Analgesic, (BIOFREEZE) 4 % GEL Apply 1 application topically 2 (two) times daily.    Marland Kitchen  polyvinyl alcohol (LIQUIFILM TEARS) 1.4 % ophthalmic solution 1 drop 2 (two) times daily. Wait 3-5 minutes between 2 eye meds.    . senna (SENOKOT) 8.6 MG TABS Take 2 tablets by mouth daily.     . sertraline (ZOLOFT) 100 MG tablet Take 100 mg by mouth daily.    Marland Kitchen UNABLE TO FIND Med Name: Med pass 90 mL by mouth four times daily for nutritional support    . UNABLE TO FIND Med Name: Mineral oil. Administer 5 drops into each ear every night at bedtime      ROS:  Unable to obtain secondary to dementia      Physical Examination  Vitals:   01/02/16 1015 01/02/16 1030 01/02/16 1100 01/02/16 1300  BP:  (!) 130/105 135/85 146/87  Pulse:  67 70 70  Resp:  22 22   Temp:    98.3 F (36.8 C)  TempSrc:    Oral  SpO2: 94% 92% 92% 90%    There is no height or weight on file to calculate BMI.  General:  Alert  and oriented x 1, no acute distress Cardiac: Regular Rate and Rhythm  Abdomen: Soft, non-tender, non-distended, broad aortic pulsation left mid abdomen Skin: No rash Extremity Pulses:  2+ radial, brachial, femoral, absent dorsalis pedis, posterior tibial pulses bilaterally Musculoskeletal: No deformity or edema  DATA:  CT images reviewed non ruptured juxtarenal AAA 8 cm severe angulation of neck with less than 5 mm length of seal zone in neck  ASSESSMENT:  8 cm AAA non ruptured not a candidate for stent graft secondary to anatomy.  I would not consider her for open repair due to her overall deconditioning and fairly severe dementia.  I believe open repair if she survived would significantly diminish her quality of life with minimal gain.  I discussed all of this with the patient's family as well as the fact that the risk of rupture within the next year is 50% and that if this ruptures her mortality is 100% without repair.  They understand that we are not going to fix the aneurysm and if she were to develop signs of symptoms of rupture our plan would be palliative care only in the form of pain control   PLAN:  No repair of AAA Palliative care in the event of signs or symptoms of rupture   Fabienne Bruns, MD [facility] Office: [phone] Pager: [phone]

## 2016-01-02 NOTE — Discharge Instructions (Signed)
Your follow-up with your family doctor as needed

## 2016-01-02 NOTE — ED Notes (Signed)
Family at bedside. Dr. Darrick Penna to come to bedside to consult.

## 2016-01-12 ENCOUNTER — Non-Acute Institutional Stay (SKILLED_NURSING_FACILITY): Payer: Medicare Other | Admitting: Internal Medicine

## 2016-01-12 ENCOUNTER — Encounter: Payer: Self-pay | Admitting: Internal Medicine

## 2016-01-12 DIAGNOSIS — K5909 Other constipation: Secondary | ICD-10-CM | POA: Diagnosis not present

## 2016-01-12 DIAGNOSIS — I714 Abdominal aortic aneurysm, without rupture, unspecified: Secondary | ICD-10-CM | POA: Insufficient documentation

## 2016-01-12 DIAGNOSIS — E43 Unspecified severe protein-calorie malnutrition: Secondary | ICD-10-CM | POA: Insufficient documentation

## 2016-01-12 DIAGNOSIS — D638 Anemia in other chronic diseases classified elsewhere: Secondary | ICD-10-CM

## 2016-01-12 DIAGNOSIS — D508 Other iron deficiency anemias: Secondary | ICD-10-CM

## 2016-01-12 DIAGNOSIS — N183 Chronic kidney disease, stage 3 unspecified: Secondary | ICD-10-CM | POA: Insufficient documentation

## 2016-01-12 NOTE — Progress Notes (Signed)
Patient ID: Mallory Herring, female   DOB: [birth year], 82 y.o.   MRN: [id number]      [facility]  Code Status: DNR  Chief Complaint  Patient presents with  . Medical Management of Chronic Issues    Routine Visit    Allergies  Allergen Reactions  . Penicillins     Noted on MAR   Advanced Directives 01/02/2016  Does patient have an advance directive? Yes  Type of Advance Directive Out of facility DNR (pink MOST or yellow form)  Does patient want to make changes to advanced directive? No - Patient declined  Copy of advanced directive(s) in chart? Yes  Pre-existing out of facility DNR order (yellow form or pink MOST form) Yellow form placed in chart (order not valid for inpatient use)   Extended Emergency Contact Information Primary Emergency Contact: Reeves,Phillip Address: [address]          [place] [address] [place] Home Phone: [phone] Work Phone: [phone] Mobile Phone: [phone] Relation: Nephew Secondary Emergency Contact: Reeves,Clara Address: [address]          [place], [place] [address] [place] of [place] Home Phone: [phone] Mobile Phone: [phone] Relation: Niece    HPI 82 y/o female patient is seen for routine visit. She denies any concern. No concern from nursing this visit. Of noted she was in ED on 01/02/16 with abdominal pain. Denies any abdominal pain this visit. She underwent CT abdomen showing AAA and vascular surgery was consulted. Conservative management was decided upon after discussion with family.   Review of Systems : limited with her dementia Constitutional: Negative for fever Respiratory: Negative for cough, shortness of breath Cardiovascular: Negative for chest pain.  Gastrointestinal: Negative for heartburn, nausea, vomiting, abdominal pain Genitourinary: Negative for dysuria.  Musculoskeletal: no fall reported Skin: Negative for itching and rash.     Past Medical  History:  Diagnosis Date  . Aftercare for healing traumatic fracture of hip   . Anemia   . Anxiety   . CVA (cerebral infarction)   . Dementia   . Depression   . Difficulty in walking(719.7)   . Dysphagia   . Hemiparesis (HCC)    right  . Hyperlipemia   . Muscle weakness (generalized)   . Osteoporosis   . Personal history of fall   . Personal history of traumatic brain injury        Medication List       Accurate as of 01/12/16 11:27 AM. Always use your most recent med list.          aspirin 81 MG tablet Take 81 mg by mouth daily.   BIOFREEZE 4 % Gel Generic drug:  Menthol (Topical Analgesic) Apply 1 application topically 4 (four) times daily.   calcium-vitamin D 500-400 MG-UNIT tablet Commonly known as:  OSCAL-500 Take 1 tablet by mouth 2 (two) times daily.   cholecalciferol 1000 units tablet Commonly known as:  VITAMIN D Take 1,000 Units by mouth daily.   divalproex 125 MG DR tablet Commonly known as:  DEPAKOTE Take 250 mg by mouth 2 (two) times daily.   donepezil 10 MG tablet Commonly known as:  ARICEPT Take 10 mg by mouth daily.   ferrous sulfate 325 (65 FE) MG tablet Take 325 mg by mouth 2 (two) times daily with a meal.   HYDROcodone-acetaminophen 5-325 MG tablet Commonly known as:  NORCO/VICODIN Take 1 tablet by mouth every 8 (eight) hours as needed for  moderate pain.   ipratropium-albuterol 0.5-2.5 (3) MG/3ML Soln Commonly known as:  DUONEB Take 3 mLs by nebulization every 6 (six) hours as needed.   lidocaine 5 % Commonly known as:  LIDODERM Place 1 patch onto the skin daily. Remove & Discard patch within 12 hours or as directed by MD   LORazepam 0.5 MG tablet Commonly known as:  ATIVAN Take 1/2 tablet by mouth twice daily as needed for agitation/anxiety   polyvinyl alcohol 1.4 % ophthalmic solution Commonly known as:  LIQUIFILM TEARS 1 drop 2 (two) times daily. Wait 3-5 minutes between 2 eye meds.   senna 8.6 MG Tabs tablet Commonly  known as:  SENOKOT Take 2 tablets by mouth daily.   sertraline 100 MG tablet Commonly known as:  ZOLOFT Take 100 mg by mouth daily.   UNABLE TO FIND Med Name: Med pass 90 mL by mouth four times daily for nutritional support   UNABLE TO FIND Med Name: Mineral oil. Administer 5 drops into each ear every night at bedtime      Immunization History  Administered Date(s) Administered  . Influenza-Unspecified 12/26/2013, 12/31/2014  . PPD Test 07/01/2012, 01/21/2014, 12/31/2014  . Pneumococcal-Unspecified 01/09/2014    Physical exam BP 121/73   Pulse 66   Resp 20   Ht 5\' 5"  (1.651 m)   Wt 118 lb (53.5 kg)   BMI 19.64 kg/m   Wt Readings from Last 3 Encounters:  01/12/16 118 lb (53.5 kg)  12/16/15 117 lb 12.8 oz (53.4 kg)  11/05/15 117 lb 12.8 oz (53.4 kg)   General- elderly thin built, frail, female in no acute distress Head- atraumatic, normocephalic Eyes- no pallor, no icterus Neck- no lymphadenopathy Cardiovascular- normal s1,s2, no murmur, no leg edema Respiratory- bilateral clear to auscultation Abdomen- bowel sounds present, soft, non tender Musculoskeletal- restricted movement to left shoulder, + mild kyphosis, arthritis changes in her fingers, on wheelchair, left sided weakness> right side, good radial pulse Skin- warm and dry Psychiatry- pleasantly confused with poor insight  Labs CBC Latest Ref Rng & Units 01/02/2016 01/02/2016 09/01/2015  WBC 4.0 - 10.5 K/uL - 5.7 5.9  Hemoglobin 12.0 - 15.0 g/dL 11/01/2015 11.9(L) 11.9(A)  Hematocrit 36.0 - 46.0 % 38.0 38.8 39  Platelets 150 - 400 K/uL - 145(L) 221   CMP Latest Ref Rng & Units 01/02/2016 01/02/2016 09/01/2015  Glucose 65 - 99 mg/dL 85 87 -  BUN 6 - 20 mg/dL 11/01/2015) 02(H) 85(I)  Creatinine 0.44 - 1.00 mg/dL 77(O) 2.42(P) 5.36(R)  Sodium 135 - 145 mmol/L 140 139 139  Potassium 3.5 - 5.1 mmol/L 4.1 4.2 4.4  Chloride 101 - 111 mmol/L 99(L) 103 -  CO2 22 - 32 mmol/L - 29 -  Calcium 8.9 - 10.3 mg/dL - 8.9 -  Total  Protein 6.5 - 8.1 g/dL - 6.8 -  Total Bilirubin 0.3 - 1.2 mg/dL - 0.5 -  Alkaline Phos 38 - 126 U/L - 53 71  AST 15 - 41 U/L - 19 16  ALT 14 - 54 U/L - 12(L) 15    Imaging  CT abdomen and pelvis with contrast 01/02/16 IMPRESSION: 1. Large, infrarenal, abdominal aortic aneurysm, measuring 8.5 x 6.3 cm, with significant mural thrombus. No evidence of aneurysm rupture/leakage. 2. No acute findings within the abdomen pelvis. 3. No bowel obstruction or inflammation. Normal appendix visualized.     Assessment/plan  AAA Denies abdominal pain at present. She was seen by vascular surgery team at the ED and conservative management has  been decided. Monitor clinically  Iron def anemia H&h stable. Currently on feso4 325 mg bid, check ferritin level. Change feso4 to daily   anemia of chronic disease With ckd, monitor cbc periodically  ckd stage 3 Monitor bmp.  Chronic constipation Senna has been helpful, continue this  Protein calorie malnutrition Severe, decline anticipated with her advanced dementia. Continue medpass supplement. Monitor oral intake     Oneal Grout, MD Internal Medicine [facility] [address] [place], [place] [address] Cell Phone (Monday-Friday 8 am - 5 pm): [phone] On Call: [phone] and follow prompts after 5 pm and on weekends Office Phone: [phone] Office Fax: [phone]

## 2016-02-11 ENCOUNTER — Encounter: Payer: Self-pay | Admitting: Internal Medicine

## 2016-02-11 ENCOUNTER — Non-Acute Institutional Stay (SKILLED_NURSING_FACILITY): Payer: Medicare Other | Admitting: Internal Medicine

## 2016-02-11 DIAGNOSIS — E559 Vitamin D deficiency, unspecified: Secondary | ICD-10-CM | POA: Diagnosis not present

## 2016-02-11 DIAGNOSIS — F01518 Vascular dementia, unspecified severity, with other behavioral disturbance: Secondary | ICD-10-CM

## 2016-02-11 DIAGNOSIS — F0151 Vascular dementia with behavioral disturbance: Secondary | ICD-10-CM | POA: Diagnosis not present

## 2016-02-11 DIAGNOSIS — M150 Primary generalized (osteo)arthritis: Secondary | ICD-10-CM | POA: Diagnosis not present

## 2016-02-11 NOTE — Progress Notes (Signed)
Patient ID: Mallory Herring, female   DOB: 05/26/1930, 82 y.o.   MRN: [id number]      [facility]  Code Status: DNR  Chief Complaint  Patient presents with  . Medical Management of Chronic Issues    Routine Visit    Allergies  Allergen Reactions  . Penicillins     Noted on MAR   Advanced Directives 01/02/2016  Does patient have an advance directive? Yes  Type of Advance Directive Out of facility DNR (pink MOST or yellow form)  Does patient want to make changes to advanced directive? No - Patient declined  Copy of advanced directive(s) in chart? Yes  Pre-existing out of facility DNR order (yellow form or pink MOST form) Yellow form placed in chart (order not valid for inpatient use)   Extended Emergency Contact Information Primary Emergency Contact: Reeves,Phillip Address: [address]          [place] [address] [place] Home Phone: [phone] Work Phone: [phone] Mobile Phone: [phone] Relation: Nephew Secondary Emergency Contact: Reeves,Clara Address: [address]          [place], [place] [address] [place] of [place] Home Phone: [phone] Mobile Phone: [phone] Relation: Niece    HPI 82 y/o female patient is seen for routine visit. No new concern from patient or nursing staff this visit. She has been compliant with her medications. No falls reported. No pressure sores reported. History taking and review of system is limited from patient with advanced dementia.   Review of Systems : limited with her dementia Constitutional: Negative for fever Respiratory: Negative for shortness of breath Cardiovascular: Negative for chest pain.  Gastrointestinal: Negative for vomiting, abdominal pain Genitourinary: Negative for dysuria.  Musculoskeletal: Transfers around with her wheelchair. Is out of bed daily. Skin: Negative for itching and rash.     Past Medical History:  Diagnosis Date  . Aftercare for  healing traumatic fracture of hip   . Anemia   . Anxiety   . CVA (cerebral infarction)   . Dementia   . Depression   . Difficulty in walking(719.7)   . Dysphagia   . Hemiparesis (HCC)    right  . Hyperlipemia   . Muscle weakness (generalized)   . Osteoporosis   . Personal history of fall   . Personal history of traumatic brain injury        Medication List       Accurate as of 02/11/16  2:41 PM. Always use your most recent med list.          aspirin 81 MG tablet Take 81 mg by mouth daily.   BIOFREEZE 4 % Gel Generic drug:  Menthol (Topical Analgesic) Apply 1 application topically 4 (four) times daily.   calcium-vitamin D 500-400 MG-UNIT tablet Commonly known as:  OSCAL-500 Take 1 tablet by mouth 2 (two) times daily.   cholecalciferol 1000 units tablet Commonly known as:  VITAMIN D Take 1,000 Units by mouth daily.   divalproex 125 MG DR tablet Commonly known as:  DEPAKOTE Take 250 mg by mouth 2 (two) times daily.   donepezil 10 MG tablet Commonly known as:  ARICEPT Take 10 mg by mouth at bedtime.   ferrous sulfate 325 (65 FE) MG tablet Take 325 mg by mouth daily.   HYDROcodone-acetaminophen 5-325 MG tablet Commonly known as:  NORCO/VICODIN Take 1 tablet by mouth every 8 (eight) hours as needed for moderate pain.   ipratropium-albuterol 0.5-2.5 (3) MG/3ML Soln Commonly  known as:  DUONEB Take 3 mLs by nebulization every 6 (six) hours as needed.   lidocaine 5 % Commonly known as:  LIDODERM Place 1 patch onto the skin daily. Remove & Discard patch within 12 hours or as directed by MD   LORazepam 0.5 MG tablet Commonly known as:  ATIVAN Take 1/2 tablet by mouth twice daily as needed for agitation/anxiety   polyvinyl alcohol 1.4 % ophthalmic solution Commonly known as:  LIQUIFILM TEARS 1 drop 2 (two) times daily. Wait 3-5 minutes between 2 eye meds.   senna 8.6 MG Tabs tablet Commonly known as:  SENOKOT Take 2 tablets by mouth daily.   sertraline  100 MG tablet Commonly known as:  ZOLOFT Take 100 mg by mouth at bedtime.   UNABLE TO FIND Med Name: Med pass 90 mL by mouth four times daily for nutritional support   UNABLE TO FIND Med Name: Mineral oil. Administer 5 drops into each ear every night at bedtime      Immunization History  Administered Date(s) Administered  . Influenza-Unspecified 12/26/2013, 12/31/2014  . PPD Test 07/01/2012, 01/21/2014, 12/31/2014  . Pneumococcal-Unspecified 01/09/2014    Physical exam BP 116/69   Pulse 73   Temp 97.3 F (36.3 C) (Oral)   Resp 20   Ht 5\' 5"  (1.651 m)   Wt 116 lb 9.6 oz (52.9 kg)   SpO2 97%   BMI 19.40 kg/m   Wt Readings from Last 3 Encounters:  02/11/16 116 lb 9.6 oz (52.9 kg)  01/12/16 118 lb (53.5 kg)  12/16/15 117 lb 12.8 oz (53.4 kg)   General- elderly thin built, frail, female in no acute distress Head- atraumatic, normocephalic Eyes- no pallor, no icterus Neck- no lymphadenopathy Cardiovascular- normal s1,s2, no murmur, no leg edema Respiratory- bilateral clear to auscultation Abdomen- bowel sounds present, soft, non tender Musculoskeletal- restricted movement to left shoulder, + mild kyphosis, arthritis changes in her fingers, on wheelchair, left sided weakness> right side, good radial pulse Skin- warm and dry Psychiatry- pleasantly confused with poor insight  Labs CBC Latest Ref Rng & Units 01/02/2016 01/02/2016 09/01/2015  WBC 4.0 - 10.5 K/uL - 5.7 5.9  Hemoglobin 12.0 - 15.0 g/dL 11/01/2015 11.9(L) 11.9(A)  Hematocrit 36.0 - 46.0 % 38.0 38.8 39  Platelets 150 - 400 K/uL - 145(L) 221   CMP Latest Ref Rng & Units 01/02/2016 01/02/2016 09/01/2015  Glucose 65 - 99 mg/dL 85 87 -  BUN 6 - 20 mg/dL 11/01/2015) 96(E) 95(M)  Creatinine 0.44 - 1.00 mg/dL 84(X) 3.24(M) 0.10(U)  Sodium 135 - 145 mmol/L 140 139 139  Potassium 3.5 - 5.1 mmol/L 4.1 4.2 4.4  Chloride 101 - 111 mmol/L 99(L) 103 -  CO2 22 - 32 mmol/L - 29 -  Calcium 8.9 - 10.3 mg/dL - 8.9 -  Total Protein 6.5 -  8.1 g/dL - 6.8 -  Total Bilirubin 0.3 - 1.2 mg/dL - 0.5 -  Alkaline Phos 38 - 126 U/L - 53 71  AST 15 - 41 U/L - 19 16  ALT 14 - 54 U/L - 12(L) 15    Imaging  CT abdomen and pelvis with contrast 01/02/16 IMPRESSION: 1. Large, infrarenal, abdominal aortic aneurysm, measuring 8.5 x 6.3 cm, with significant mural thrombus. No evidence of aneurysm rupture/leakage. 2. No acute findings within the abdomen pelvis. 3. No bowel obstruction or inflammation. Normal appendix visualized.     Assessment/plan  Advanced dementia without behavioral disturbance Has vascular dementia. Continue Aricept 10 mg daily. Provide supportive  care.  Osteoarthritis Continue with Biofreeze gel to her shoulder and her knees.  Vitamin D deficiency Continue vitamin D supplement for now. Fall precautions to be taken.  Oneal Grout, MD Internal Medicine [facility] [address] [place], [place] [address] Cell Phone (Monday-Friday 8 am - 5 pm): [phone] On Call: [phone] and follow prompts after 5 pm and on weekends Office Phone: [phone] Office Fax: [phone]

## 2016-03-15 ENCOUNTER — Non-Acute Institutional Stay (SKILLED_NURSING_FACILITY): Payer: Medicare Other | Admitting: Internal Medicine

## 2016-03-15 ENCOUNTER — Encounter: Payer: Self-pay | Admitting: Internal Medicine

## 2016-03-15 DIAGNOSIS — M19012 Primary osteoarthritis, left shoulder: Secondary | ICD-10-CM | POA: Diagnosis not present

## 2016-03-15 DIAGNOSIS — F01518 Vascular dementia, unspecified severity, with other behavioral disturbance: Secondary | ICD-10-CM

## 2016-03-15 DIAGNOSIS — E43 Unspecified severe protein-calorie malnutrition: Secondary | ICD-10-CM

## 2016-03-15 DIAGNOSIS — F0151 Vascular dementia with behavioral disturbance: Secondary | ICD-10-CM | POA: Diagnosis not present

## 2016-03-15 NOTE — Progress Notes (Signed)
Patient ID: Mallory Herring, female   DOB: 11-19-1930, 82 y.o.   MRN: [id number]      [facility]  Code Status: DNR  Chief Complaint  Patient presents with  . Medical Management of Chronic Issues    Routine Visit    Allergies  Allergen Reactions  . Penicillins     Noted on MAR   Advanced Directives 01/02/2016  Does Patient Have a Medical Advance Directive? Yes  Type of Advance Directive Out of facility DNR (pink MOST or yellow form)  Does patient want to make changes to medical advance directive? No - Patient declined  Copy of Healthcare Power of Attorney in Chart? Yes  Pre-existing out of facility DNR order (yellow form or pink MOST form) Yellow form placed in chart (order not valid for inpatient use)   Extended Emergency Contact Information Primary Emergency Contact: Reeves,Phillip Address: [address]          [place] [address] [place] of [place] Home Phone: [phone] Work Phone: [phone] Mobile Phone: [phone] Relation: Nephew Secondary Emergency Contact: Reeves,Clara Address: [address]          [place], [place] [address] [place] of [place] Home Phone: [phone] Mobile Phone: [phone] Relation: Niece    HPI 82 y/o female patient is seen for routine visit. She denies any concern. No concern from nursing this visit.   Review of Systems : limited with her dementia Constitutional: Negative for fever Respiratory: Negative for cough, shortness of breath Cardiovascular: Negative for chest pain.  Gastrointestinal: Negative for heartburn, nausea, vomiting, abdominal pain Genitourinary: Negative for dysuria.  Musculoskeletal: no fall reported Skin: Negative for itching and rash.     Past Medical History:  Diagnosis Date  . Aftercare for healing traumatic fracture of hip   . Anemia   . Anxiety   . CVA (cerebral infarction)   . Dementia   . Depression   . Difficulty in walking(719.7)   . Dysphagia     . Hemiparesis (HCC)    right  . Hyperlipemia   . Muscle weakness (generalized)   . Osteoporosis   . Personal history of fall   . Personal history of traumatic brain injury      Allergies as of 03/15/2016      Reactions   Penicillins    Noted on [facility]      Medication List       Accurate as of 03/15/16 11:01 AM. Always use your most recent med list.          aspirin 81 MG tablet Take 81 mg by mouth daily.   BIOFREEZE 4 % Gel Generic drug:  Menthol (Topical Analgesic) Apply 1 application topically 4 (four) times daily.   calcium-vitamin D 500-400 MG-UNIT tablet Commonly known as:  OSCAL-500 Take 1 tablet by mouth 2 (two) times daily.   cholecalciferol 1000 units tablet Commonly known as:  VITAMIN D Take 1,000 Units by mouth daily.   divalproex 125 MG DR tablet Commonly known as:  DEPAKOTE Take 250 mg by mouth 2 (two) times daily.   donepezil 10 MG tablet Commonly known as:  ARICEPT Take 10 mg by mouth at bedtime.   ferrous sulfate 325 (65 FE) MG tablet Take 325 mg by mouth daily.   HYDROcodone-acetaminophen 5-325 MG tablet Commonly known as:  NORCO/VICODIN Take 1 tablet by mouth every 8 (eight) hours.   ipratropium-albuterol 0.5-2.5 (3) MG/3ML Soln Commonly known as:  DUONEB Take 3 mLs by nebulization every  6 (six) hours as needed.   lidocaine 5 % Commonly known as:  LIDODERM Place 1 patch onto the skin daily. Remove & Discard patch within 12 hours or as directed by MD   LORazepam 0.5 MG tablet Commonly known as:  ATIVAN Take 1/2 tablet by mouth twice daily as needed for agitation/anxiety   polyvinyl alcohol 1.4 % ophthalmic solution Commonly known as:  LIQUIFILM TEARS 1 drop 2 (two) times daily. Wait 3-5 minutes between 2 eye meds.   senna 8.6 MG Tabs tablet Commonly known as:  SENOKOT Take 2 tablets by mouth daily.   sertraline 100 MG tablet Commonly known as:  ZOLOFT Take 100 mg by mouth at bedtime.   UNABLE TO FIND Med Name: Med pass 90  mL by mouth four times daily for nutritional support   UNABLE TO FIND Med Name: Mineral oil. Administer 5 drops into each ear every night at bedtime      Immunization History  Administered Date(s) Administered  . Influenza-Unspecified 12/26/2013, 12/31/2014  . PPD Test 07/01/2012, 01/21/2014, 12/31/2014  . Pneumococcal-Unspecified 01/09/2014    Physical exam BP 109/62   Pulse 78   Temp 97.9 F (36.6 C) (Oral)   Resp 18   Ht 5\' 5"  (1.651 m)   Wt 121 lb 9.6 oz (55.2 kg)   SpO2 94%   BMI 20.24 kg/m   Wt Readings from Last 3 Encounters:  03/15/16 121 lb 9.6 oz (55.2 kg)  02/11/16 116 lb 9.6 oz (52.9 kg)  01/12/16 118 lb (53.5 kg)   General- elderly thin built, frail, female in no acute distress Head- atraumatic, normocephalic Eyes- no pallor, no icterus Neck- no lymphadenopathy Cardiovascular- normal s1,s2, no murmur, no leg edema Respiratory- bilateral clear to auscultation Abdomen- bowel sounds present, soft, non tender Musculoskeletal- restricted movement to left shoulder, + mild kyphosis, arthritis changes in her fingers, on wheelchair, left sided weakness> right side, good radial pulse Skin- warm and dry Psychiatry- pleasantly confused with poor insight  Labs CBC Latest Ref Rng & Units 01/02/2016 01/02/2016 09/01/2015  WBC 4.0 - 10.5 K/uL - 5.7 5.9  Hemoglobin 12.0 - 15.0 g/dL 94.5 11.9(L) 11.9(A)  Hematocrit 36.0 - 46.0 % 38.0 38.8 39  Platelets 150 - 400 K/uL - 145(L) 221   CMP Latest Ref Rng & Units 01/02/2016 01/02/2016 09/01/2015  Glucose 65 - 99 mg/dL 85 87 -  BUN 6 - 20 mg/dL 03(U) 88(K) 80(K)  Creatinine 0.44 - 1.00 mg/dL 3.49(Z) 7.91(T) 0.5(W)  Sodium 135 - 145 mmol/L 140 139 139  Potassium 3.5 - 5.1 mmol/L 4.1 4.2 4.4  Chloride 101 - 111 mmol/L 99(L) 103 -  CO2 22 - 32 mmol/L - 29 -  Calcium 8.9 - 10.3 mg/dL - 8.9 -  Total Protein 6.5 - 8.1 g/dL - 6.8 -  Total Bilirubin 0.3 - 1.2 mg/dL - 0.5 -  Alkaline Phos 38 - 126 U/L - 53 71  AST 15 - 41 U/L - 19 16   ALT 14 - 54 U/L - 12(L) 15    Imaging  CT abdomen and pelvis with contrast 01/02/16 IMPRESSION: 1. Large, infrarenal, abdominal aortic aneurysm, measuring 8.5 x 6.3 cm, with significant mural thrombus. No evidence of aneurysm rupture/leakage. 2. No acute findings within the abdomen pelvis. 3. No bowel obstruction or inflammation. Normal appendix visualized.     Assessment/plan  Vascular Dementia with behavioral disturbance Continue Aricept 10 mg daily. Provide supportive care. To be followed by psych services. Continue Depakote 125 mg 2 capsules  twice daily and Zoloft 100 mg daily at bedtime for her behaviors.  Protein calorie malnutrition Monitor oral intake. Continue fortified food with meals. Patient being followed by a registered dietitian.  Left shoulder osteoarthritis Continue lidocaine 5% patch and Biofreeze gel to her left shoulder. Continue calcium and vitamin D supplement. Follow-up precautions.     Oneal Grout, MD Internal Medicine [facility] [address] [place], [place] [address] Cell Phone (Monday-Friday 8 am - 5 pm): [phone] On Call: [phone] and follow prompts after 5 pm and on weekends Office Phone: [phone] Office Fax: [phone]

## 2016-04-06 LAB — CBC AND DIFFERENTIAL
HCT: 37 % (ref 36–46)
Hemoglobin: 11.5 g/dL — AB (ref 12.0–16.0)
Platelets: 154 K/µL (ref 150–399)
WBC: 5.4 10*3/mL

## 2016-04-06 LAB — BASIC METABOLIC PANEL
BUN: 26 mg/dL — AB (ref 4–21)
CREATININE: 1 mg/dL (ref 0.5–1.1)
GLUCOSE: 150 mg/dL
POTASSIUM: 4.1 mmol/L (ref 3.4–5.3)
SODIUM: 140 mmol/L (ref 137–147)

## 2016-04-07 ENCOUNTER — Encounter: Payer: Self-pay | Admitting: Internal Medicine

## 2016-04-07 ENCOUNTER — Non-Acute Institutional Stay (SKILLED_NURSING_FACILITY): Payer: Medicare Other | Admitting: Internal Medicine

## 2016-04-07 DIAGNOSIS — W19XXXA Unspecified fall, initial encounter: Secondary | ICD-10-CM | POA: Diagnosis not present

## 2016-04-07 DIAGNOSIS — M159 Polyosteoarthritis, unspecified: Secondary | ICD-10-CM | POA: Diagnosis not present

## 2016-04-07 DIAGNOSIS — F329 Major depressive disorder, single episode, unspecified: Secondary | ICD-10-CM | POA: Diagnosis not present

## 2016-04-07 NOTE — Progress Notes (Signed)
Patient ID: Mallory Herring, female   DOB: Mar 02, 1931, 83 y.o.   MRN: [id number]      [facility]  Code Status: DNR  Chief Complaint  Patient presents with  . Medical Management of Chronic Issues    Routine Visit    Allergies  Allergen Reactions  . Penicillins     Noted on MAR   Advanced Directives 01/02/2016  Does Patient Have a Medical Advance Directive? Yes  Type of Advance Directive Out of facility DNR (pink MOST or yellow form)  Does patient want to make changes to medical advance directive? No - Patient declined  Copy of Healthcare Power of Attorney in Chart? Yes  Pre-existing out of facility DNR order (yellow form or pink MOST form) Yellow form placed in chart (order not valid for inpatient use)   Extended Emergency Contact Information Primary Emergency Contact: Reeves,Phillip Address: [address]          [place] [address] [place] of [place] Home Phone: [phone] Work Phone: [phone] Mobile Phone: [phone] Relation: Nephew Secondary Emergency Contact: Reeves,Clara Address: [address]          [place], [place] [address] [place] of [place] Home Phone: [phone] Mobile Phone: [phone] Relation: Niece    HPI 83 y/o female patient is seen for routine visit. She denies any concern. She had a fall 3 days back with no apparent injury. She was trying to transfer unassisted. She has advanced dementia and is a fall risk.  Review of Systems : limited with her dementia Constitutional: Negative for fever Respiratory: Negative for cough, shortness of breath Cardiovascular: Negative for chest pain.  Gastrointestinal: Negative for heartburn, nausea, vomiting, abdominal pain Genitourinary: Negative for dysuria.  Skin: Negative for itching and rash.     Past Medical History:  Diagnosis Date  . Aftercare for healing traumatic fracture of hip   . Anemia   . Anxiety   . CVA (cerebral infarction)   . Dementia     . Depression   . Difficulty in walking(719.7)   . Dysphagia   . Hemiparesis (HCC)    right  . Hyperlipemia   . Muscle weakness (generalized)   . Osteoporosis   . Personal history of fall   . Personal history of traumatic brain injury      Allergies as of 04/07/2016      Reactions   Penicillins    Noted on [facility]      Medication List       Accurate as of 04/07/16  3:33 PM. Always use your most recent med list.          aspirin 81 MG tablet Take 81 mg by mouth daily.   BIOFREEZE 4 % Gel Generic drug:  Menthol (Topical Analgesic) Apply 1 application topically 4 (four) times daily.   calcium-vitamin D 500-400 MG-UNIT tablet Commonly known as:  OSCAL-500 Take 1 tablet by mouth 2 (two) times daily.   cholecalciferol 1000 units tablet Commonly known as:  VITAMIN D Take 1,000 Units by mouth daily.   divalproex 125 MG DR tablet Commonly known as:  DEPAKOTE Take 250 mg by mouth 2 (two) times daily.   donepezil 10 MG tablet Commonly known as:  ARICEPT Take 10 mg by mouth at bedtime.   ferrous sulfate 325 (65 FE) MG tablet Take 325 mg by mouth daily.   HYDROcodone-acetaminophen 5-325 MG tablet Commonly known as:  NORCO/VICODIN Take 1 tablet by mouth every 8 (eight) hours.  ipratropium-albuterol 0.5-2.5 (3) MG/3ML Soln Commonly known as:  DUONEB Take 3 mLs by nebulization every 6 (six) hours as needed.   lidocaine 5 % Commonly known as:  LIDODERM Place 1 patch onto the skin daily. Remove & Discard patch within 12 hours or as directed by MD   ondansetron 4 MG tablet Commonly known as:  ZOFRAN Take 4 mg by mouth every 6 (six) hours as needed for nausea or vomiting.   polyvinyl alcohol 1.4 % ophthalmic solution Commonly known as:  LIQUIFILM TEARS 1 drop 2 (two) times daily. Wait 3-5 minutes between 2 eye meds.   senna 8.6 MG Tabs tablet Commonly known as:  SENOKOT Take 2 tablets by mouth daily.   sertraline 100 MG tablet Commonly known as:  ZOLOFT Take 100  mg by mouth at bedtime.   UNABLE TO FIND Med Name: Mineral oil. Administer 5 drops into each ear every night at bedtime      Immunization History  Administered Date(s) Administered  . Influenza-Unspecified 12/26/2013, 12/31/2014  . PPD Test 07/01/2012, 01/21/2014, 12/31/2014  . Pneumococcal-Unspecified 01/09/2014    Physical exam BP 121/68   Pulse 85   Temp 98.1 F (36.7 C) (Oral)   Resp 18   Ht 5\' 5"  (1.651 m)   Wt 112 lb 9.6 oz (51.1 kg)   SpO2 97%   BMI 18.74 kg/m   Wt Readings from Last 3 Encounters:  04/07/16 112 lb 9.6 oz (51.1 kg)  03/15/16 121 lb 9.6 oz (55.2 kg)  02/11/16 116 lb 9.6 oz (52.9 kg)   General- elderly thin built, frail, female in no acute distress Head- atraumatic, normocephalic Eyes- no pallor, no icterus Neck- no lymphadenopathy Cardiovascular- normal s1,s2, no murmur, no leg edema Respiratory- bilateral clear to auscultation Abdomen- bowel sounds present, soft, non tender Musculoskeletal- restricted movement to left shoulder, + mild kyphosis, arthritis changes in her fingers, on wheelchair, weakness left > right Skin- warm and dry Psychiatry- pleasantly confused with poor insight   Labs CBC Latest Ref Rng & Units 01/02/2016 01/02/2016 09/01/2015  WBC 4.0 - 10.5 K/uL - 5.7 5.9  Hemoglobin 12.0 - 15.0 g/dL 54.3 11.9(L) 11.9(A)  Hematocrit 36.0 - 46.0 % 38.0 38.8 39  Platelets 150 - 400 K/uL - 145(L) 221   CMP Latest Ref Rng & Units 01/02/2016 01/02/2016 09/01/2015  Glucose 65 - 99 mg/dL 85 87 -  BUN 6 - 20 mg/dL 60(O) 77(C) 34(K)  Creatinine 0.44 - 1.00 mg/dL 3.52(Y) 8.18(H) 9.0(B)  Sodium 135 - 145 mmol/L 140 139 139  Potassium 3.5 - 5.1 mmol/L 4.1 4.2 4.4  Chloride 101 - 111 mmol/L 99(L) 103 -  CO2 22 - 32 mmol/L - 29 -  Calcium 8.9 - 10.3 mg/dL - 8.9 -  Total Protein 6.5 - 8.1 g/dL - 6.8 -  Total Bilirubin 0.3 - 1.2 mg/dL - 0.5 -  Alkaline Phos 38 - 126 U/L - 53 71  AST 15 - 41 U/L - 19 16  ALT 14 - 54 U/L - 12(L) 15    Imaging  CT  abdomen and pelvis with contrast 01/02/16 IMPRESSION: 1. Large, infrarenal, abdominal aortic aneurysm, measuring 8.5 x 6.3 cm, with significant mural thrombus. No evidence of aneurysm rupture/leakage. 2. No acute findings within the abdomen pelvis. 3. No bowel obstruction or inflammation. Normal appendix visualized.     Assessment/plan  Fall Her dementia with poor insight, soft blood pressure and pain medication could have contributed to this fall. Denies pain this visit. Currently on norco  5-325 mg tid. On chart review has soft BP. Change norco to 5-325 mg bid. Fall precautions. No signs of infection on clinical exam. Check cbc and bmp for infection and lyte imbalance.   Severe OA Continue norco but reduce to 5-325 mg bid from tid. Continue lidocaine patch and biofreeze gel.   Chronic depression Continue Depakote 125 mg 2 capsules twice daily and Zoloft 100 mg daily at bedtime for her behaviors.    Oneal Grout, MD Internal Medicine [facility] [address] [place], [place] [address] Cell Phone (Monday-Friday 8 am - 5 pm): [phone] On Call: [phone] and follow prompts after 5 pm and on weekends Office Phone: [phone] Office Fax: [phone]

## 2016-05-08 ENCOUNTER — Other Ambulatory Visit: Payer: Self-pay | Admitting: *Deleted

## 2016-05-08 MED ORDER — HYDROCODONE-ACETAMINOPHEN 5-325 MG PO TABS: ORAL_TABLET | ORAL | 0 refills | Status: AC

## 2016-05-08 NOTE — Telephone Encounter (Signed)
[facility] #[phone] Fax: [phone] 

## 2016-06-14 ENCOUNTER — Encounter: Payer: Self-pay | Admitting: Internal Medicine

## 2016-06-14 ENCOUNTER — Non-Acute Institutional Stay (SKILLED_NURSING_FACILITY): Payer: Medicare Other | Admitting: Internal Medicine

## 2016-06-14 DIAGNOSIS — G301 Alzheimer's disease with late onset: Secondary | ICD-10-CM | POA: Diagnosis not present

## 2016-06-14 DIAGNOSIS — D508 Other iron deficiency anemias: Secondary | ICD-10-CM | POA: Diagnosis not present

## 2016-06-14 DIAGNOSIS — F0281 Dementia in other diseases classified elsewhere with behavioral disturbance: Secondary | ICD-10-CM | POA: Diagnosis not present

## 2016-06-14 NOTE — Progress Notes (Signed)
Patient ID: Mallory Herring, female   DOB: December 31, 1930, 83 y.o.   MRN: [id number]      [facility]  Code Status: DNR  Chief Complaint  Patient presents with  . Medical Management of Chronic Issues    Routine Visit     Allergies  Allergen Reactions  . Penicillins     Noted on MAR   Advanced Directives 01/02/2016  Does Patient Have a Medical Advance Directive? Yes  Type of Advance Directive Out of facility DNR (pink MOST or yellow form)  Does patient want to make changes to medical advance directive? No - Patient declined  Copy of Healthcare Power of Attorney in Chart? Yes  Pre-existing out of facility DNR order (yellow form or pink MOST form) Yellow form placed in chart (order not valid for inpatient use)   Extended Emergency Contact Information Primary Emergency Contact: Reeves,Phillip Address: [address]          [place] [address] [place] of [place] Home Phone: [phone] Work Phone: [phone] Mobile Phone: [phone] Relation: Nephew Secondary Emergency Contact: Reeves,Clara Address: [address]          [place], [place] [address] [place] of [place] Home Phone: [phone] Mobile Phone: [phone] Relation: Niece    HPI 83 y/o female patient is seen for routine visit. She denies any concern. She has advanced dementia and is a fall risk. No concern from nursing.   Review of Systems : limited with her dementia Constitutional: Negative for fever Respiratory: Negative for cough, shortness of breath Cardiovascular: Negative for chest pain.  Gastrointestinal: Negative for nausea, vomiting, abdominal pain Genitourinary: Negative for dysuria.  Skin: Negative for itching and rash.     Past Medical History:  Diagnosis Date  . Aftercare for healing traumatic fracture of hip   . Anemia   . Anxiety   . CVA (cerebral infarction)   . Dementia   . Depression   . Difficulty in walking(719.7)   . Dysphagia   .  Hemiparesis (HCC)    right  . Hyperlipemia   . Muscle weakness (generalized)   . Osteoporosis   . Personal history of fall   . Personal history of traumatic brain injury      Allergies as of 06/14/2016      Reactions   Penicillins    Noted on [facility]      Medication List       Accurate as of 06/14/16  2:48 PM. Always use your most recent med list.          aspirin 81 MG tablet Take 81 mg by mouth daily.   BIOFREEZE 4 % Gel Generic drug:  Menthol (Topical Analgesic) Apply 1 application topically 4 (four) times daily.   calcium-vitamin D 500-400 MG-UNIT tablet Commonly known as:  OSCAL-500 Take 1 tablet by mouth 2 (two) times daily.   cholecalciferol 1000 units tablet Commonly known as:  VITAMIN D Take 1,000 Units by mouth daily.   divalproex 125 MG DR tablet Commonly known as:  DEPAKOTE Take 250 mg by mouth 2 (two) times daily.   donepezil 10 MG tablet Commonly known as:  ARICEPT Take 10 mg by mouth at bedtime.   ferrous sulfate 325 (65 FE) MG tablet Take 325 mg by mouth daily.   HYDROcodone-acetaminophen 5-325 MG tablet Commonly known as:  NORCO/VICODIN Take one tablet by mouth twice daily for pain. Hold for SBP <110 and sedation. Do not exceed 3gm of Tylenol in 24 hours  ipratropium-albuterol 0.5-2.5 (3) MG/3ML Soln Commonly known as:  DUONEB Take 3 mLs by nebulization every 6 (six) hours as needed.   lidocaine 5 % Commonly known as:  LIDODERM Place 1 patch onto the skin daily. Remove & Discard patch within 12 hours or as directed by MD   ondansetron 4 MG tablet Commonly known as:  ZOFRAN Take 4 mg by mouth every 6 (six) hours as needed for nausea or vomiting.   polyvinyl alcohol 1.4 % ophthalmic solution Commonly known as:  LIQUIFILM TEARS 1 drop 2 (two) times daily. Wait 3-5 minutes between 2 eye meds.   senna 8.6 MG Tabs tablet Commonly known as:  SENOKOT Take 2 tablets by mouth daily.   sertraline 100 MG tablet Commonly known as:   ZOLOFT Take 100 mg by mouth at bedtime.   UNABLE TO FIND Med Name: Mineral oil. Administer 5 drops into each ear every night at bedtime      Immunization History  Administered Date(s) Administered  . Influenza-Unspecified 12/26/2013, 12/31/2014  . PPD Test 07/01/2012, 01/21/2014, 12/31/2014  . Pneumococcal-Unspecified 01/09/2014    Physical exam BP 126/84   Pulse 76   Temp 98.2 F (36.8 C) (Oral)   Resp 18   Ht 5\' 5"  (1.651 m)   Wt 110 lb (49.9 kg)   SpO2 98%   BMI 18.30 kg/m   Wt Readings from Last 3 Encounters:  06/14/16 110 lb (49.9 kg)  04/07/16 112 lb 9.6 oz (51.1 kg)  03/15/16 121 lb 9.6 oz (55.2 kg)   General- elderly thin built, frail, female in no acute distress Head- atraumatic, normocephalic Eyes- no pallor, no icterus Neck- no lymphadenopathy Cardiovascular- normal s1,s2, no murmur, no leg edema Respiratory- bilateral clear to auscultation Abdomen- bowel sounds present, soft, non tender Musculoskeletal- restricted movement to left shoulder, + mild kyphosis, arthritis changes in her fingers, on wheelchair, weakness left > right Skin- warm and dry Psychiatry- pleasantly confused with poor insight   Labs CBC Latest Ref Rng & Units 04/06/2016 01/02/2016 01/02/2016  WBC 10:3/mL 5.4 - 5.7  Hemoglobin 12.0 - 16.0 g/dL 11.5(A) 12.9 11.9(L)  Hematocrit 36 - 46 % 37 38.0 38.8  Platelets 150 - 399 K/L 154 - 145(L)   CMP Latest Ref Rng & Units 04/06/2016 01/02/2016 01/02/2016  Glucose 65 - 99 mg/dL - 85 87  BUN 4 - 21 mg/dL 03/03/2016) 62(B) 76(E)  Creatinine 0.5 - 1.1 mg/dL 1.0 83(T) 5.17(O)  Sodium 137 - 147 mmol/L 140 140 139  Potassium 3.4 - 5.3 mmol/L 4.1 4.1 4.2  Chloride 101 - 111 mmol/L - 99(L) 103  CO2 22 - 32 mmol/L - - 29  Calcium 8.9 - 10.3 mg/dL - - 8.9  Total Protein 6.5 - 8.1 g/dL - - 6.8  Total Bilirubin 0.3 - 1.2 mg/dL - - 0.5  Alkaline Phos 38 - 126 U/L - - 53  AST 15 - 41 U/L - - 19  ALT 14 - 54 U/L - - 12(L)    Imaging  CT abdomen and  pelvis with contrast 01/02/16 IMPRESSION: 1. Large, infrarenal, abdominal aortic aneurysm, measuring 8.5 x 6.3 cm, with significant mural thrombus. No evidence of aneurysm rupture/leakage. 2. No acute findings within the abdomen pelvis. 3. No bowel obstruction or inflammation. Normal appendix visualized.     Assessment/plan  Alzheimer's dementia with behavior disturbance Continue aricept 10 mg daily and depakote 250 mg bid, supportive care  Iron def anemia Reviewed cbc. Continue iron supplement    Martinique Pizzimenti, MD  Internal Medicine [facility] [address] [place], [place] [address] Cell Phone (Monday-Friday 8 am - 5 pm): [phone] On Call: [phone] and follow prompts after 5 pm and on weekends Office Phone: [phone] Office Fax: [phone]

## 2016-07-23 ENCOUNTER — Emergency Department (HOSPITAL_COMMUNITY): Payer: Medicare Other

## 2016-07-23 ENCOUNTER — Encounter (HOSPITAL_COMMUNITY): Payer: Self-pay | Admitting: Emergency Medicine

## 2016-07-23 ENCOUNTER — Emergency Department (HOSPITAL_COMMUNITY)
Admission: EM | Admit: 2016-07-23 | Discharge: 2016-07-25 | Disposition: E | Payer: Medicare Other | Attending: Emergency Medicine | Admitting: Emergency Medicine

## 2016-07-23 DIAGNOSIS — Z8673 Personal history of transient ischemic attack (TIA), and cerebral infarction without residual deficits: Secondary | ICD-10-CM | POA: Diagnosis not present

## 2016-07-23 DIAGNOSIS — R4182 Altered mental status, unspecified: Secondary | ICD-10-CM

## 2016-07-23 DIAGNOSIS — E872 Acidosis, unspecified: Secondary | ICD-10-CM

## 2016-07-23 DIAGNOSIS — I713 Abdominal aortic aneurysm, ruptured, unspecified: Secondary | ICD-10-CM

## 2016-07-23 DIAGNOSIS — I959 Hypotension, unspecified: Secondary | ICD-10-CM | POA: Insufficient documentation

## 2016-07-23 DIAGNOSIS — D649 Anemia, unspecified: Secondary | ICD-10-CM | POA: Diagnosis not present

## 2016-07-23 DIAGNOSIS — Z7982 Long term (current) use of aspirin: Secondary | ICD-10-CM | POA: Diagnosis not present

## 2016-07-23 DIAGNOSIS — N179 Acute kidney failure, unspecified: Secondary | ICD-10-CM | POA: Insufficient documentation

## 2016-07-23 DIAGNOSIS — N183 Chronic kidney disease, stage 3 (moderate): Secondary | ICD-10-CM | POA: Insufficient documentation

## 2016-07-23 LAB — CBC WITH DIFFERENTIAL/PLATELET
BASOS ABS: 0 10*3/uL (ref 0.0–0.1)
BASOS PCT: 0 %
Eosinophils Absolute: 0 10*3/uL (ref 0.0–0.7)
Eosinophils Relative: 0 %
HCT: 26.4 % — ABNORMAL LOW (ref 36.0–46.0)
HEMOGLOBIN: 8.3 g/dL — AB (ref 12.0–15.0)
Lymphocytes Relative: 11 %
Lymphs Abs: 1.5 10*3/uL (ref 0.7–4.0)
MCH: 28.1 pg (ref 26.0–34.0)
MCHC: 31.4 g/dL (ref 30.0–36.0)
MCV: 89.5 fL (ref 78.0–100.0)
Monocytes Absolute: 0.7 10*3/uL (ref 0.1–1.0)
Monocytes Relative: 5 %
NEUTROS PCT: 84 %
Neutro Abs: 11.5 10*3/uL — ABNORMAL HIGH (ref 1.7–7.7)
Platelets: 166 10*3/uL (ref 150–400)
RBC: 2.95 MIL/uL — AB (ref 3.87–5.11)
RDW: 16.4 % — ABNORMAL HIGH (ref 11.5–15.5)
WBC: 13.8 10*3/uL — AB (ref 4.0–10.5)

## 2016-07-23 LAB — COMPREHENSIVE METABOLIC PANEL
ALK PHOS: 38 U/L (ref 38–126)
ALT: 10 U/L — ABNORMAL LOW (ref 14–54)
AST: 20 U/L (ref 15–41)
Albumin: 2.6 g/dL — ABNORMAL LOW (ref 3.5–5.0)
Anion gap: 11 (ref 5–15)
BUN: 41 mg/dL — ABNORMAL HIGH (ref 6–20)
CALCIUM: 7.8 mg/dL — AB (ref 8.9–10.3)
CO2: 23 mmol/L (ref 22–32)
CREATININE: 1.5 mg/dL — AB (ref 0.44–1.00)
Chloride: 101 mmol/L (ref 101–111)
GFR, EST AFRICAN AMERICAN: 36 mL/min — AB (ref >60)
GFR, EST NON AFRICAN AMERICAN: 31 mL/min — AB (ref >60)
Glucose, Bld: 280 mg/dL — ABNORMAL HIGH (ref 65–99)
Potassium: 3.5 mmol/L (ref 3.5–5.1)
Sodium: 135 mmol/L (ref 135–145)
TOTAL PROTEIN: 5.2 g/dL — AB (ref 6.5–8.1)
Total Bilirubin: 0.3 mg/dL (ref 0.3–1.2)

## 2016-07-23 LAB — URINALYSIS, ROUTINE W REFLEX MICROSCOPIC
BILIRUBIN URINE: NEGATIVE
Glucose, UA: >=500 mg/dL — AB
Ketones, ur: NEGATIVE mg/dL
Leukocytes, UA: NEGATIVE
NITRITE: NEGATIVE
PROTEIN: NEGATIVE mg/dL
Specific Gravity, Urine: 1.009 (ref 1.005–1.030)
Squamous Epithelial / LPF: NONE SEEN
pH: 6 (ref 5.0–8.0)

## 2016-07-23 LAB — PROTIME-INR
INR: 1.28
Prothrombin Time: 16.1 seconds — ABNORMAL HIGH (ref 11.4–15.2)

## 2016-07-23 LAB — I-STAT TROPONIN, ED: TROPONIN I, POC: 0.04 ng/mL (ref 0.00–0.08)

## 2016-07-23 LAB — I-STAT CG4 LACTIC ACID, ED: Lactic Acid, Venous: 3.97 mmol/L (ref 0.5–1.9)

## 2016-07-23 MED ORDER — ONDANSETRON HCL 4 MG/2ML IJ SOLN
INTRAMUSCULAR | Status: AC
  Administered 2016-07-23: 2 mg
  Filled 2016-07-23: qty 2

## 2016-07-23 MED ORDER — NALOXONE HCL 0.4 MG/ML IJ SOLN
INTRAMUSCULAR | Status: AC
  Administered 2016-07-23: 0.4 mg
  Filled 2016-07-23: qty 1

## 2016-07-23 MED ORDER — VANCOMYCIN HCL IN DEXTROSE 1-5 GM/200ML-% IV SOLN
1000.0000 mg | Freq: Once | INTRAVENOUS | Status: DC
  Filled 2016-07-23: qty 200

## 2016-07-23 MED ORDER — DEXTROSE 5 % IV SOLN
1.0000 g | Freq: Once | INTRAVENOUS | Status: AC
  Administered 2016-07-23: 1 g via INTRAVENOUS
  Filled 2016-07-23 (×3): qty 1

## 2016-07-23 MED ORDER — SODIUM CHLORIDE 0.9 % IV BOLUS (SEPSIS)
1000.0000 mL | Freq: Once | INTRAVENOUS | Status: AC
Start: 2016-07-23 — End: 2016-07-23
  Administered 2016-07-23: 1000 mL via INTRAVENOUS

## 2016-07-23 MED ORDER — IOPAMIDOL (ISOVUE-370) INJECTION 76%
INTRAVENOUS | Status: AC
  Administered 2016-07-23: 100 mL
  Filled 2016-07-23: qty 100

## 2016-07-23 MED ORDER — LORAZEPAM 2 MG/ML IJ SOLN
INTRAMUSCULAR | Status: AC
  Administered 2016-07-23: 0.5 mg
  Filled 2016-07-23: qty 1

## 2016-07-23 MED ORDER — NALOXONE HCL 0.4 MG/ML IJ SOLN: 0.4000 mg | Freq: Once | INTRAMUSCULAR | Status: DC

## 2016-07-25 NOTE — ED Provider Notes (Signed)
 [facility]Y DEPT Provider Note   CSN: [id number] Arrival date & time: 08-14-16  2021     History   Chief Complaint Chief Complaint  Patient presents with  . Altered Mental Status  . Hypotension    HPI Mallory Herring is a 83 y.o. female.  The history is provided by the patient.  Altered Mental Status   This is a new problem. The current episode started 1 to 2 hours ago. The problem has been gradually improving. Associated symptoms include somnolence. Risk factors: Pt is nursing home patient on chronic opioids. Her past medical history is significant for CVA and dementia. Her past medical history does not include seizures or head trauma.    Past Medical History:  Diagnosis Date  . Aftercare for healing traumatic fracture of hip   . Anemia   . Anxiety   . CVA (cerebral infarction)   . Dementia   . Depression   . Difficulty in walking(719.7)   . Dysphagia   . Hemiparesis (HCC)    right  . Hyperlipemia   . Muscle weakness (generalized)   . Osteoporosis   . Personal history of fall   . Personal history of traumatic brain injury     Patient Active Problem List   Diagnosis Date Noted  . Severe protein-calorie malnutrition (HCC) 01/12/2016  . CKD (chronic kidney disease) stage 3, GFR 30-59 ml/min 01/12/2016  . AAA (abdominal aortic aneurysm) without rupture (HCC) 01/12/2016  . PVD (peripheral vascular disease) (HCC) 12/16/2015  . Age related osteoporosis 12/16/2015  . Adjustment disorder with mixed anxiety and depressed mood 07/06/2015  . Insufficiency of lacrimal sac 07/06/2015  . Rotator cuff tear 07/06/2015  . Osteoporosis of disuse 07/06/2015  . Annual physical exam 07/06/2015  . Hemiparesis affecting left side as late effect of stroke (HCC) 07/06/2015  . Hemiparesis affecting left side as late effect of cerebrovascular accident (HCC) 06/04/2015  . Major depression, chronic 04/05/2015  . Protein-calorie malnutrition, moderate (HCC) 04/05/2015  . Protein-calorie  malnutrition (HCC) 12/06/2014  . Vascular dementia with behavior disturbance 12/06/2014  . Congestive heart disease (HCC) 12/06/2014  . Primary generalized (osteo)arthritis 10/05/2014  . Vitamin D deficiency 08/13/2014  . Chronic constipation 08/13/2014  . Depression due to dementia 08/13/2014  . Gastroesophageal reflux disease without esophagitis 07/15/2014  . Hemiplga following cerebral infrc affecting left nondom side (HCC) 07/15/2014  . Anemia, iron deficiency 06/24/2014  . Degenerative joint disease of shoulder region 06/24/2014  . Slow transit constipation 06/24/2014  . Depression with anxiety 04/02/2014  . Vascular dementia 04/02/2014  . CVA, old, cognitive deficits 04/02/2014  . CVA, old, hemiparesis (HCC) 04/02/2014  . Thyroid activity decreased 03/02/2014  . Anemia of chronic disease 01/09/2014  . Senile dementia, uncomplicated 07/31/2013  . Dementia 04/26/2013  . Hyperlipidemia 04/26/2013  . Anxiety 01/17/2013  . Right hemiparesis (HCC) 01/17/2013  . Anemia of other chronic disease 11/15/2012  . Long term (current) use of anticoagulants 07/03/2012  . Generalized anxiety disorder 07/03/2012  . Other and unspecified hyperlipidemia 07/03/2012  . Dementia with behavioral disturbance 07/03/2012  . History of CVA (cerebrovascular accident) 07/03/2012  . Depression 07/03/2012  . Unspecified vitamin D deficiency 07/03/2012  . Hip fracture, right S/P hemiarthroplasty 07/03/2012    History reviewed. No pertinent surgical history.  OB History    No data available       Home Medications    Prior to Admission medications   Medication Sig Start Date End Date Taking? Authorizing Provider  aspirin 81 MG tablet  Take 81 mg by mouth daily.   Yes Historical Provider, MD  calcium-vitamin D (OSCAL-500) 500-400 MG-UNIT per tablet Take 1 tablet by mouth 2 (two) times daily.   Yes Historical Provider, MD  cholecalciferol (VITAMIN D) 1000 UNITS tablet Take 1,000 Units by mouth daily.    Yes Historical Provider, MD  Dextran 70-Hypromellose (ARTIFICIAL TEARS PF OP) Place 1 drop into both eyes 2 (two) times daily.   Yes Historical Provider, MD  divalproex (DEPAKOTE) 125 MG DR tablet Take 250 mg by mouth 2 (two) times daily.    Yes Historical Provider, MD  donepezil (ARICEPT) 10 MG tablet Take 10 mg by mouth at bedtime.    Yes Historical Provider, MD  ferrous sulfate 325 (65 FE) MG tablet Take 325 mg by mouth daily.    Yes Historical Provider, MD  HYDROcodone-acetaminophen (NORCO/VICODIN) 5-325 MG tablet Take one tablet by mouth twice daily for pain. Hold for SBP <110 and sedation. Do not exceed 3gm of Tylenol in 24 hours Patient taking differently: Take 1 tablet by mouth 2 (two) times daily. HOLD FOR SYSTOLIC B/P <110 OR SEDATION 1/96/22  Yes Tiffany L Reed, DO  ipratropium-albuterol (DUONEB) 0.5-2.5 (3) MG/3ML SOLN Take 3 mLs by nebulization every 6 (six) hours as needed (for wheezing).    Yes Historical Provider, MD  lidocaine (LIDODERM) 5 % Place 1 patch onto the skin daily. LEFT SHOULDER and remove & Discard patch within 12 hours or as directed by MD   Yes Historical Provider, MD  Menthol, Topical Analgesic, (BIOFREEZE) 4 % GEL Apply 1 application topically 4 (four) times daily. LEFT SHOULDER   Yes Historical Provider, MD  ondansetron (ZOFRAN) 4 MG tablet Take 4 mg by mouth every 6 (six) hours as needed for nausea or vomiting.   Yes Historical Provider, MD  senna (SENOKOT) 8.6 MG TABS Take 2 tablets by mouth daily.    Yes Historical Provider, MD  sertraline (ZOLOFT) 100 MG tablet Take 100 mg by mouth at bedtime.    Yes Historical Provider, MD  UNABLE TO FIND Med Name: Mineral oil. Administer 5 drops into each ear every night at bedtime   Yes Historical Provider, MD    Family History No family history on file.  Social History Social History  Substance Use Topics  . Smoking status: Unknown If Ever Smoked  . Smokeless tobacco: Never Used  . Alcohol use No     Allergies     Penicillins   Review of Systems Review of Systems  Unable to perform ROS: Mental status change     Physical Exam Updated Vital Signs BP 131/78   Pulse 64   Temp (!) 96 F (35.6 C) (Rectal)   Resp (!) 25   SpO2 100%   Physical Exam  Constitutional: She appears cachectic. She has a sickly appearance. No distress.  HENT:  Head: Normocephalic and atraumatic.  Mouth/Throat: Mucous membranes are dry.  Eyes: Conjunctivae are normal.  Neck: Neck supple.  Cardiovascular: Normal rate and regular rhythm.   No murmur heard. Pulmonary/Chest: Effort normal and breath sounds normal. No respiratory distress.  Abdominal: Soft. There is generalized tenderness.  Musculoskeletal: She exhibits no edema.  Neurological: She is alert. No cranial nerve deficit. GCS eye subscore is 4. GCS verbal subscore is 4. GCS motor subscore is 6.  Skin: Skin is warm and dry. No rash noted.  Psychiatric: She has a normal mood and affect. Cognition and memory are impaired.  Nursing note and vitals reviewed.    ED  Treatments / Results  Labs (all labs ordered are listed, but only abnormal results are displayed) Labs Reviewed  COMPREHENSIVE METABOLIC PANEL - Abnormal; Notable for the following:       Result Value   Glucose, Bld 280 (*)    BUN 41 (*)    Creatinine, Ser 1.50 (*)    Calcium 7.8 (*)    Total Protein 5.2 (*)    Albumin 2.6 (*)    ALT 10 (*)    GFR calc non Af Amer 31 (*)    GFR calc Af Amer 36 (*)    All other components within normal limits  CBC WITH DIFFERENTIAL/PLATELET - Abnormal; Notable for the following:    WBC 13.8 (*)    RBC 2.95 (*)    Hemoglobin 8.3 (*)    HCT 26.4 (*)    RDW 16.4 (*)    Neutro Abs 11.5 (*)    All other components within normal limits  URINALYSIS, ROUTINE W REFLEX MICROSCOPIC - Abnormal; Notable for the following:    APPearance HAZY (*)    Glucose, UA >=500 (*)    Hgb urine dipstick SMALL (*)    Bacteria, UA MANY (*)    All other components within  normal limits  PROTIME-INR - Abnormal; Notable for the following:    Prothrombin Time 16.1 (*)    All other components within normal limits  I-STAT CG4 LACTIC ACID, ED - Abnormal; Notable for the following:    Lactic Acid, Venous 3.97 (*)    All other components within normal limits  CULTURE, BLOOD (ROUTINE X 2)  CULTURE, BLOOD (ROUTINE X 2)  BRAIN NATRIURETIC PEPTIDE  I-STAT TROPOININ, ED  CBG MONITORING, ED  I-STAT CG4 LACTIC ACID, ED  Rosezena Sensor, ED    EKG  EKG Interpretation None       Radiology Dg Chest Port 1 View  Result Date: Aug 18, 2016 CLINICAL DATA:  Altered mental status.  Abdominal pain. EXAM: PORTABLE CHEST 1 VIEW COMPARISON:  May 24, 2011 FINDINGS: Cardiomediastinal silhouette is normal. Skin folds are seen over the upper chest. No pulmonary nodules, masses, or focal infiltrates. IMPRESSION: No acute abnormalities identified. Electronically Signed   By: Gerome Sam III M.D   On: Aug 18, 2016 21:02    Procedures Procedures (including critical care time)  Medications Ordered in ED Medications  vancomycin (VANCOCIN) IVPB 1000 mg/200 mL premix (not administered)  sodium chloride 0.9 % bolus 1,000 mL (0 mLs Intravenous Stopped Aug 18, 2016 2230)  ondansetron (ZOFRAN) 4 MG/2ML injection (2 mg  Given 08/18/16 2101)  ceFEPIme (MAXIPIME) 1 g in dextrose 5 % 50 mL IVPB (0 g Intravenous Stopped 2016/08/18 2219)  LORazepam (ATIVAN) 2 MG/ML injection (0.5 mg  Given Aug 18, 2016 2145)  iopamidol (ISOVUE-370) 76 % injection (100 mLs  Contrast Given 08/18/16 2252)  naloxone (NARCAN) 0.4 MG/ML injection (0.4 mg  Given 08-18-2016 2226)     Initial Impression / Assessment and Plan / ED Course  I have reviewed the triage vital signs and the nursing notes.  Pertinent labs & imaging results that were available during my care of the patient were reviewed by me and considered in my medical decision making (see chart for details).     83 year old female with history of known AAA,  dementia who is a DO NOT RESUSCITATE patient presents from nursing facility in setting of altered mental status. Patient found down and altered and room with low blood pressure. On arrival of EMS patient found to have systolics of 60s and pinpoint pupils.  Patient given Narcan with improvement in blood pressure and mental status. Patient does take Norco at facility. Patient without specific complaint and stable in route to the emergency pertinent.  On arrival patient found to have low normal blood pressures. Patient slightly confused and complaining of mild abdominal pain and mild shortness of breath. Patient appears cachectic on examination and bedside ultrasound reveals large AAA. As patient is hypotensive with unknown etiology patient given broad-spectrum antibiotics, IV fluids and blood cultures are obtained. Patient found to have slight elevation of white blood cell count and hemoglobin which is decreased when compared to 2 months ago. Patient with acute kidney injury and elevated BUNs. No significant electrolyte abnormalities noted. Chest x-ray without signs of pneumonia. Urinalysis without signs of UTI. Patient had additional bout of altered mental status and given additional Narcan with improvement in mental status.  Had extensive discussion with nephew who is power of attorney and he continues to report patient is DO NOT RESUSCITATE and DO NOT INTUBATE. She reports her goal is for comfort and would like imaging of abdomen to see if patient is having bleeding of AAA in this evening. CT scan reveals bleeding abdominal aortic aneurysm. Discussed case with nephew and would like to make patient comfort care at this time. Patient mental status declined rapidly and she began having agonal breaths. Blood pressure decreased and patient's heart rhythm changed. At 2344 time of death was called and this is not a medical examiner case. Family contacted regarding death.  Final Clinical Impressions(s) / ED Diagnoses     Final diagnoses:  Hypotension, unspecified hypotension type  Altered mental status, unspecified altered mental status type  AKI (acute kidney injury) (HCC)  Lactic acid acidosis  Anemia, unspecified type  Ruptured abdominal aortic aneurysm (AAA) [facility])    New Prescriptions New Prescriptions   No medications on file     Stacy Gardner, MD 2016/08/13 2348    Rolland Porter, MD  0000

## 2016-07-25 NOTE — ED Notes (Signed)
Patient transported to CT w/ RN. 

## 2016-07-25 NOTE — ED Triage Notes (Signed)
Per EMS, pt from [facility] for altered lOC and hypotension (65pal).  She was given 500cc [place] which increased to 108.  Pupils were restricted so .4 of Narcan was given which inproved her ability to respond.  CBG 339.

## 2016-07-25 NOTE — ED Notes (Signed)
RN notified MD that pt appeared to be aspirating.  MD bedside.

## 2016-07-25 DEATH — deceased

## 2016-07-28 LAB — CULTURE, BLOOD (ROUTINE X 2)
Culture: NO GROWTH
Culture: NO GROWTH
SPECIAL REQUESTS: ADEQUATE
SPECIAL REQUESTS: ADEQUATE

## 2018-04-05 IMAGING — CT CT ANGIO CHEST-ABD-PELV FOR DISSECTION W/ AND WO/W CM
2 of 6 series · 13 of 46 positions shown, 15 images · IV contrast (OMNI 350)
Comparison: 01/02/2016

CLINICAL DATA: Anemia and hypotension.

EXAM:
CT ANGIOGRAPHY CHEST, ABDOMEN AND PELVIS
TECHNIQUE: Multidetector CT imaging through the chest, abdomen and pelvis was
performed using the standard protocol during bolus administration of
intravenous contrast. Multiplanar reconstructed images and MIPs were
obtained and reviewed to evaluate the vascular anatomy.
CONTRAST:  100 mL Isovue 370 intravenous

[Series 7: dissection 2mm · axial · 0.74mm/px · z∈[-559,-75]mm · 10 of 287 slices shown, 12 images]
[im 23/287  soft-tissue]
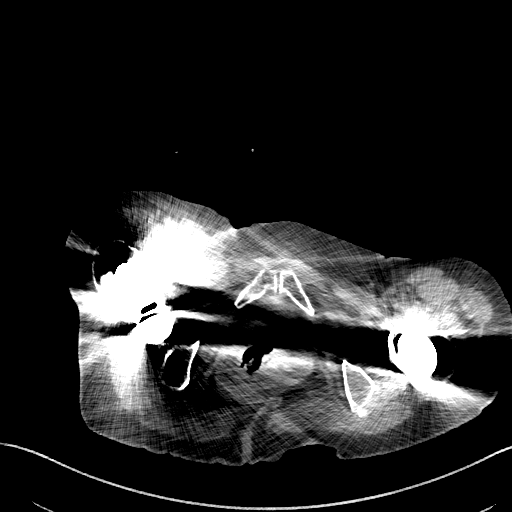
[im 23/287  bone]
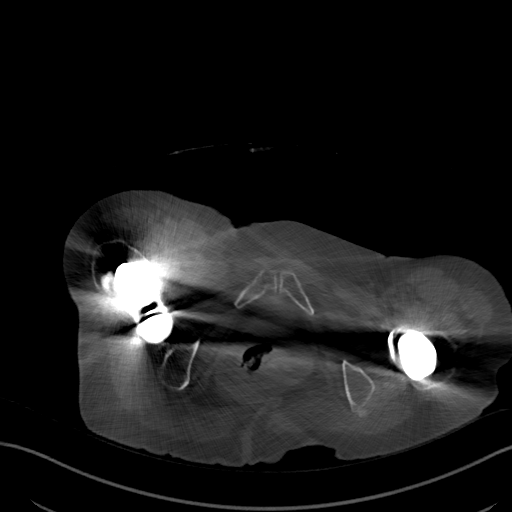
[im 45/287  soft-tissue]
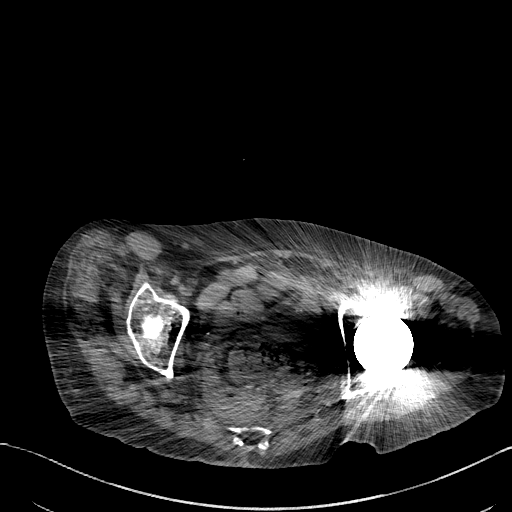
[im 78/287  soft-tissue]
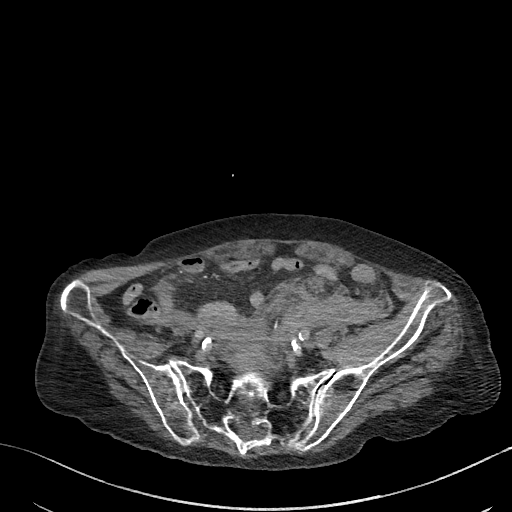
[im 100/287  soft-tissue]
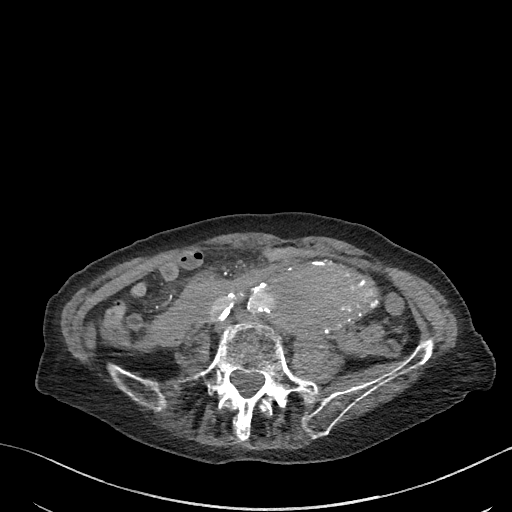
[im 133/287  soft-tissue]
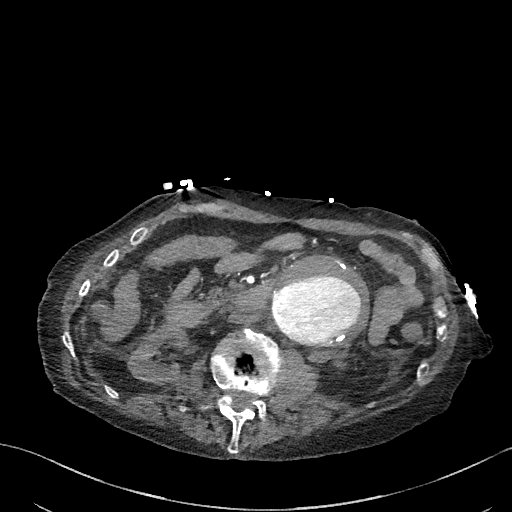
[im 155/287  soft-tissue]
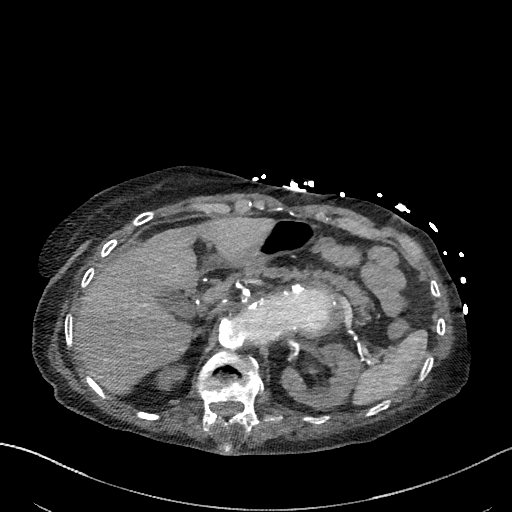
[im 188/287  soft-tissue]
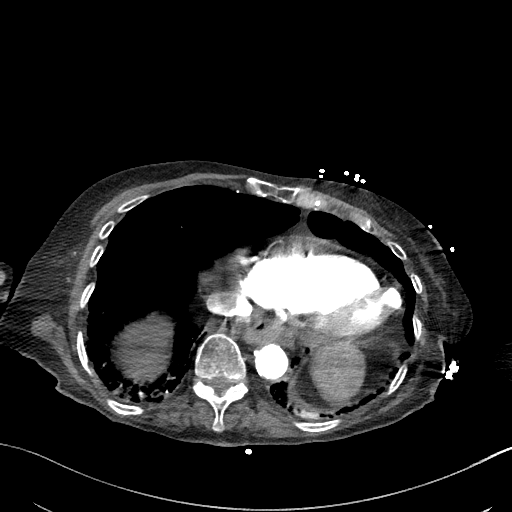
[im 210/287  soft-tissue]
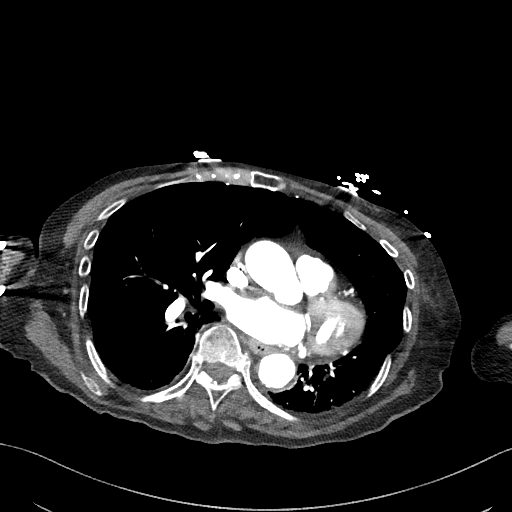
[im 243/287  soft-tissue]
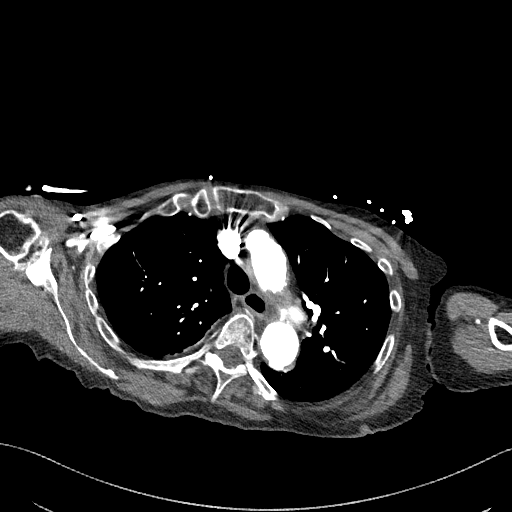
[im 243/287  bone]
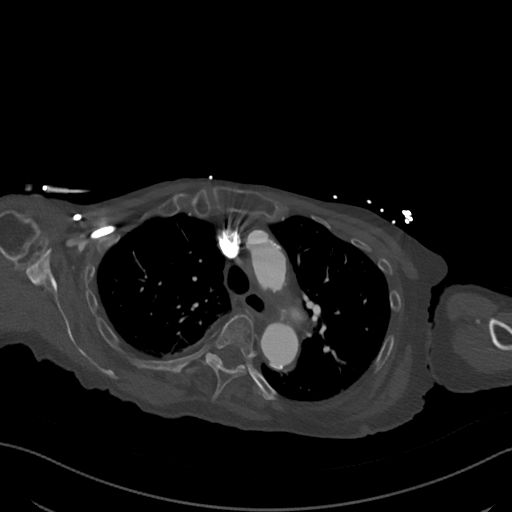
[im 265/287  soft-tissue]
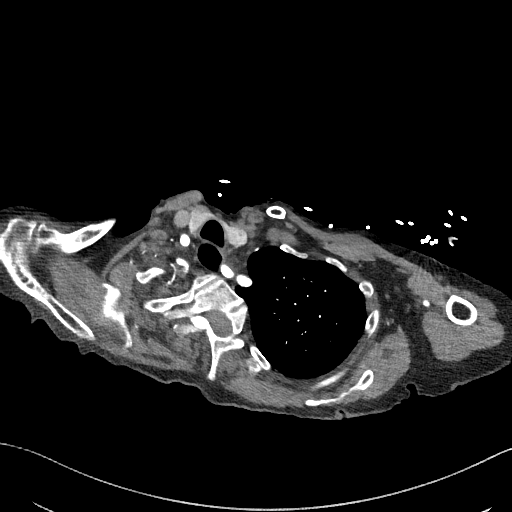

[Series 10: dissection 2mm cor · coronal · 0.70mm/px · 3 of 116 slices shown]
[im 29/116  soft-tissue]
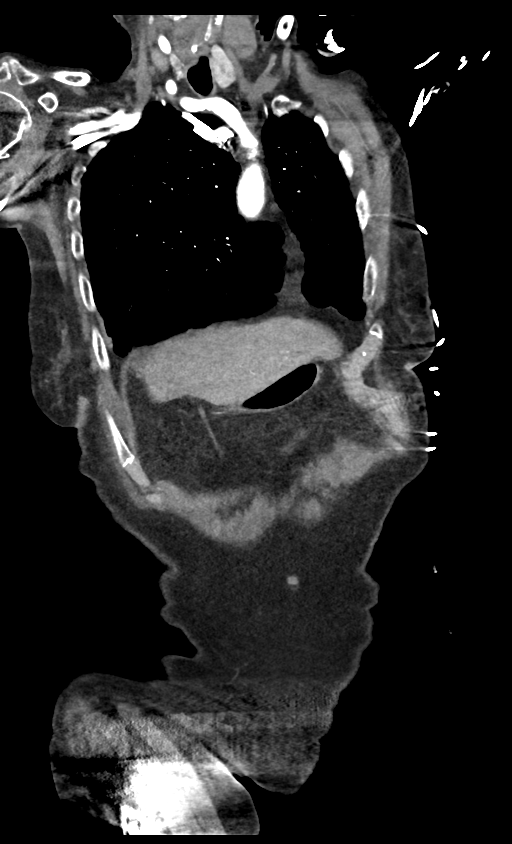
[im 58/116  soft-tissue]
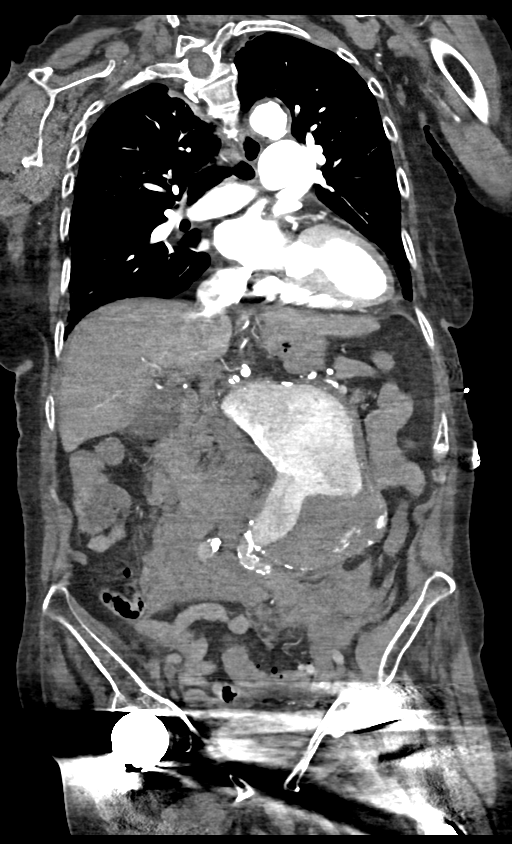
[im 87/116  soft-tissue]
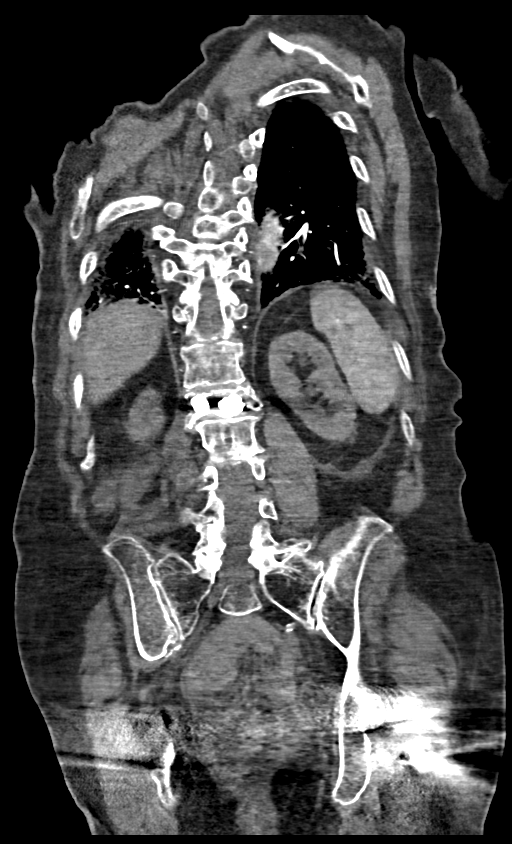

[13 of 46 positions shown; findings below may reference images not displayed]

FINDINGS: CTA CHEST FINDINGS

Cardiovascular: Preferential opacification of the thoracic aorta. No
evidence of thoracic aortic aneurysm or dissection. Normal heart
size. No pericardial effusion. Moderate atherosclerotic plaque
throughout the normal caliber thoracic aorta.

Mediastinum/Nodes: No enlarged mediastinal, hilar, or axillary lymph
nodes. Trachea and esophagus demonstrate no significant findings.
Minimal thyroid nodularity, not requiring follow-up.

Lungs/Pleura: Extensive centrilobular emphysematous disease. No
confluent airspace consolidation. No effusion. No pneumothorax.

Musculoskeletal: Moderate scoliosis and degenerative changes. No
significant skeletal lesion.

Review of the MIP images confirms the above findings.

CTA ABDOMEN AND PELVIS FINDINGS

VASCULAR

Aorta: Large abdominal aortic aneurysm beginning at the level of the
renal arteries, measuring approximately 7 x 9 cm in AP and
transverse dimensions. There is evidence of aneurysm leak, probably
located at the bottom of the aneurysm, with hemorrhage in the caudal
retroperitoneum extending into the extraperitoneal spaces of the
pelvis. There is mild attenuating material within the mural thrombus
at the caudal portions of the aneurysm, suggesting arterial contrast
extending into the mural thrombus but no conclusive active
extravasation of arterial contrast out into the retroperitoneum. The
findings are most suggestive of aneurysm leak into the
retroperitoneum which is either slow or intermittent.

Celiac: Patent.  No dissection or occlusion.

SMA: Patent.  No dissection or occlusion.

Renals: Involved with the upper portion of the aneurysm. Probable
patency bilaterally, with appearance not significantly different
from 01/02/2016.

IMA: Probably patent.

Inflow: Heavily calcified.  No dissection or occlusion.

Veins: Grossly unremarkable within the limitations of this arterial
phase study.

Review of the MIP images confirms the above findings.

NON-VASCULAR

Hepatobiliary: No focal liver abnormality is seen. No gallstones,
gallbladder wall thickening, or biliary dilatation.

Pancreas: Unremarkable. No pancreatic ductal dilatation or
surrounding inflammatory changes.

Spleen: Normal in size without focal abnormality.

Adrenals/Urinary Tract: Adrenal glands are unremarkable. Kidneys are
normal, without renal calculi, focal lesion, or hydronephrosis.
Bladder is poorly seen due to metal artifact from bilateral hip
prosthetic hardware.

Stomach/Bowel: Extensive colonic diverticulosis. No evidence of
diverticulitis or other acute bowel inflammation. Stomach and small
bowel are unremarkable.

Lymphatic: No significant vascular findings are present. No enlarged
abdominal or pelvic lymph nodes.

Reproductive: Unremarkable. Not well seen due to bilateral hip
prosthetic hardware.

Other: No other acute findings.

Musculoskeletal: No significant skeletal lesion. Stable compressions
of L1 and L4.

Review of the MIP images confirms the above findings.
IMPRESSION: 7 x 9 cm abdominal aortic aneurysm beginning at the level of the
renal arteries. Retroperitoneal hemorrhage consistent with slow or
intermittent leak from the aneurysm. Leakage point is probably at
the caudal aspect of the aneurysm. These results were called by
telephone at the time of interpretation on 07/23/2016 at [DATE] to
Dr. Greta, who verbally acknowledged these results.

Nonemergent findings include uncomplicated colonic diverticulosis
and extensive centrilobular emphysematous lung disease.

## 2018-04-05 IMAGING — CR DG CHEST 1V PORT
1 series · 1 of 1 positions shown · non-contrast
Comparison: May 24, 2011

CLINICAL DATA: Altered mental status.  Abdominal pain.

EXAM:
PORTABLE CHEST 1 VIEW

[AP]
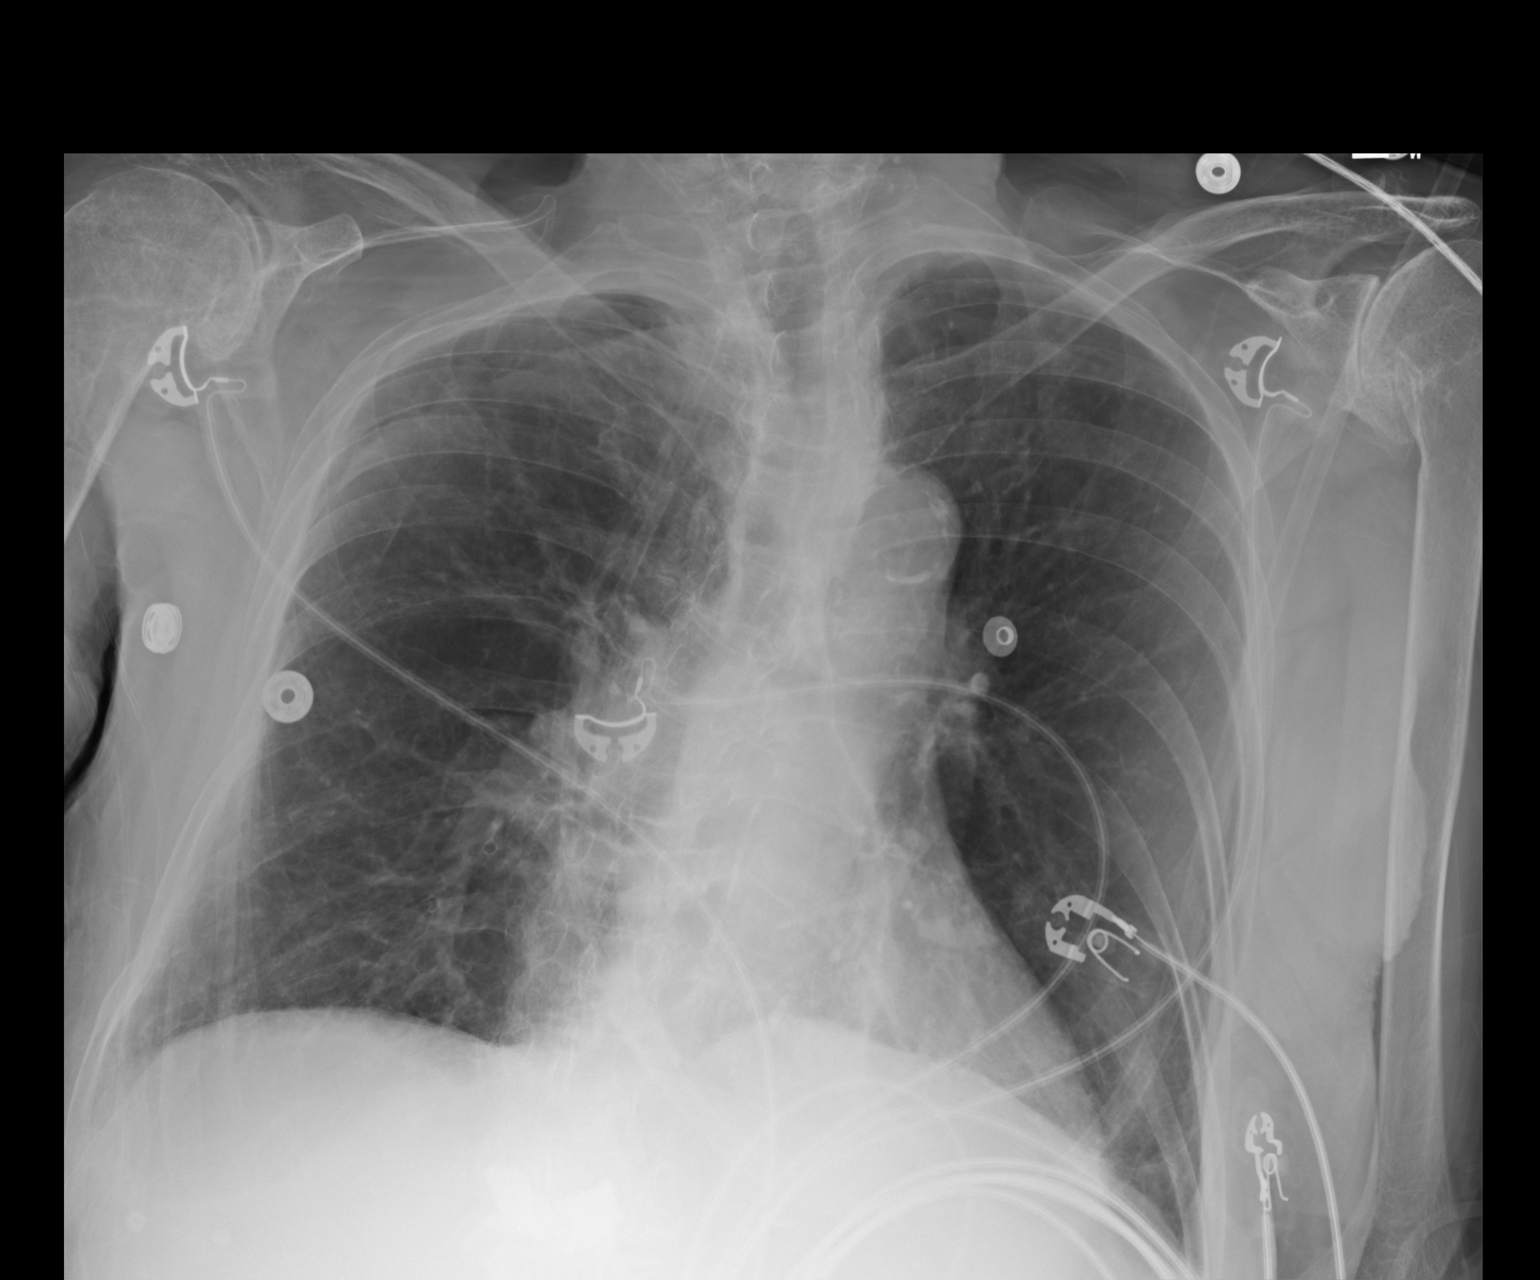

[1 of 1 positions shown; findings below may reference images not displayed]

FINDINGS: Cardiomediastinal silhouette is normal. Skin folds are seen over the
upper chest. No pulmonary nodules, masses, or focal infiltrates.
IMPRESSION: No acute abnormalities identified.
